# Patient Record
Sex: Female | Born: 2000 | Race: Black or African American | Hispanic: No | Marital: Single | Smoking: Never smoker
Health system: Southern US, Community
[De-identification: ages and names within clinical notes are randomized; demographics above are authoritative.]

## PROBLEM LIST (undated history)

## (undated) ENCOUNTER — Ambulatory Visit (HOSPITAL_COMMUNITY): Source: Home / Self Care

## (undated) DIAGNOSIS — R87629 Unspecified abnormal cytological findings in specimens from vagina: Secondary | ICD-10-CM

## (undated) DIAGNOSIS — Z789 Other specified health status: Secondary | ICD-10-CM

## (undated) HISTORY — DX: Other specified health status: Z78.9

## (undated) HISTORY — PX: NO PAST SURGERIES: SHX2092

## (undated) HISTORY — DX: Unspecified abnormal cytological findings in specimens from vagina: R87.629

---
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
## 2019-11-01 NOTE — L&D Delivery Note (Addendum)
OB/GYN Faculty Practice Delivery Note  Carla Flores is a 19 y.o. G1P0 s/p VD at [redacted]w[redacted]d.    ROM: 3h 63m with clear fluid GBS Status:  Negative/-- (09/17 1140) Maximum Maternal Temperature: 99.6 F  Labor Progress:  Patient presented to L&D for IOL for elevated blood pressure without dx of HTN. Initial SVE: thick/high/closed. Labor course was uncomplicated. She then progressed to complete.   Delivery Date/Time: 08/14/20 @ 1856 Delivery: Called to room and patient was complete and pushing. Head position was vertex and delivered with ease over the perineum. No nuchal cord present. Shoulder and body delivered in usual fashion. Infant with spontaneous cry, placed on mother's abdomen, dried and stimulated. Cord clamped x 2 after 1-minute delay, and cut by FOB. Cord blood drawn. Placenta delivered spontaneously with gentle cord traction. Fundus firm with massage and pitocin started. Labia, perineum, vagina, and cervix inspected and significant for bilateral sulcal tears and right labial 2nd degree.  Baby Weight: per chart  Cord: central insertion, 3 vessel Placenta: Sent to L&D, intact Complications: None Lacerations: bilateral sulcal and right labial, and was repaired in the standard fashion EBL: 827 cc Analgesia: Epidural  Infant: APGAR (1 MIN): 9   APGAR (5 MINS): 9   Herby Abraham MD, PGY-1 OBGYN Faculty Teaching Service  08/14/2020, 7:53 PM   I was gloved and present for entire delivery SVD without incident  No difficulty with shoulders Lacerations as listed above Repair of same supervised by me QBL 827: IV Pitocin, 800 mcg Cytotec and TXA administered  Clayton Bibles, CNM 08/14/20 8:16 PM

## 2020-02-19 ENCOUNTER — Ambulatory Visit (INDEPENDENT_AMBULATORY_CARE_PROVIDER_SITE_OTHER): Payer: Medicaid Other

## 2020-02-19 VITALS — Ht 66.0 in | Wt 123.0 lb

## 2020-02-19 DIAGNOSIS — Z3480 Encounter for supervision of other normal pregnancy, unspecified trimester: Secondary | ICD-10-CM

## 2020-02-19 DIAGNOSIS — Z3482 Encounter for supervision of other normal pregnancy, second trimester: Secondary | ICD-10-CM

## 2020-02-19 DIAGNOSIS — Z3A14 14 weeks gestation of pregnancy: Secondary | ICD-10-CM

## 2020-02-19 DIAGNOSIS — Z3402 Encounter for supervision of normal first pregnancy, second trimester: Secondary | ICD-10-CM | POA: Insufficient documentation

## 2020-02-19 MED ORDER — BLOOD PRESSURE CUFF MISC: 1.0000 | 0 refills | Status: DC

## 2020-02-19 MED ORDER — DOXYLAMINE-PYRIDOXINE 10-10 MG PO TBEC: 2.0000 | DELAYED_RELEASE_TABLET | Freq: Every day | ORAL | 5 refills | Status: DC

## 2020-02-19 MED ORDER — PREPLUS 27-1 MG PO TABS: 1.0000 | ORAL_TABLET | Freq: Every day | ORAL | 13 refills | Status: DC

## 2020-02-19 NOTE — Progress Notes (Signed)
..    Virtual Visit via Telephone Note  I connected with Carla Flores on 02/19/20 at  9:00 AM EDT by telephone and verified that I am speaking with the correct person using two identifiers.  Location: Patient: on the phone Provider: on the phone   I discussed the limitations, risks, security and privacy concerns of performing an evaluation and management service by telephone and the availability of in person appointments. I also discussed with the patient that there may be a patient responsible charge related to this service. The patient expressed understanding and agreed to proceed.   History of Present Illness: PRENATAL INTAKE SUMMARY  Ms. Mandarino presents today New OB Nurse Interview.  OB History   No obstetric history on file.    I have reviewed the patient's medical, obstetrical, social, and family histories, medications, and available lab results.  SUBJECTIVE She has no unusual complaints   Observations/Objective: Initial nurse interview for history/labs (New OB)  EDD: 08/13/20 GA: [redacted]w[redacted]d GP:G1P0     Assessment and Plan: Normal pregnancy (pt reports she had confirmation at the health dept.) Prenatal care Pt is scheduled for New OB appointment on 02/26/20.   Follow Up Instructions:   I discussed the assessment and treatment plan with the patient. The patient was provided an opportunity to ask questions and all were answered. The patient agreed with the plan and demonstrated an understanding of the instructions.   The patient was advised to call back or seek an in-person evaluation if the symptoms worsen or if the condition fails to improve as anticipated.  I provided 15 minutes of non-face-to-face time during this encounter.   Frutoso Chase, RN

## 2020-02-26 ENCOUNTER — Other Ambulatory Visit (HOSPITAL_COMMUNITY)
Admission: RE | Admit: 2020-02-26 | Discharge: 2020-02-26 | Disposition: A | Discharge status: Home / Self Care | Payer: Medicaid Other | Source: Ambulatory Visit | Attending: Obstetrics | Admitting: Obstetrics

## 2020-02-26 ENCOUNTER — Other Ambulatory Visit: Payer: Self-pay

## 2020-02-26 ENCOUNTER — Ambulatory Visit (INDEPENDENT_AMBULATORY_CARE_PROVIDER_SITE_OTHER): Payer: Medicaid Other | Admitting: Obstetrics

## 2020-02-26 ENCOUNTER — Encounter: Payer: Self-pay | Admitting: Obstetrics

## 2020-02-26 VITALS — BP 120/84 | HR 112 | Wt 123.0 lb

## 2020-02-26 DIAGNOSIS — Z3481 Encounter for supervision of other normal pregnancy, first trimester: Secondary | ICD-10-CM

## 2020-02-26 DIAGNOSIS — Z3402 Encounter for supervision of normal first pregnancy, second trimester: Secondary | ICD-10-CM | POA: Insufficient documentation

## 2020-02-26 MED ORDER — PRENATE MINI 29-0.6-0.4-350 MG PO CAPS: 1.0000 | ORAL_CAPSULE | Freq: Every day | ORAL | 3 refills | Status: DC

## 2020-02-26 MED ORDER — VITAFOL ULTRA 29-0.6-0.4-200 MG PO CAPS: 1.0000 | ORAL_CAPSULE | Freq: Every day | ORAL | 4 refills | Status: DC

## 2020-02-26 NOTE — Progress Notes (Signed)
NOB   NOB intake completed 02/19/20  CC:  NONE

## 2020-02-26 NOTE — Progress Notes (Addendum)
Subjective:    Carla Flores is being seen today for her first obstetrical visit.  This is not a planned pregnancy. She is at [redacted]w[redacted]d gestation. Her obstetrical history is significant for none. Relationship with FOB: significant other, not living together. Patient does intend to breast feed. Pregnancy history fully reviewed.  The information documented in the HPI was reviewed and verified.  Menstrual History: OB History    Gravida  1   Para      Term      Preterm      AB      Living        SAB      TAB      Ectopic      Multiple      Live Births               Patient's last menstrual period was 11/07/2019 (exact date).    History reviewed. No pertinent past medical history.  Past Surgical History:  Procedure Laterality Date  . NO PAST SURGERIES      (Not in a hospital admission)  No Known Allergies  Social History   Tobacco Use  . Smoking status: Never Smoker  . Smokeless tobacco: Never Used  Substance Use Topics  . Alcohol use: Never    Family History  Problem Relation Age of Onset  . Diabetes Maternal Grandmother      Review of Systems Constitutional: negative for weight loss Gastrointestinal: negative for vomiting Genitourinary:negative for genital lesions and vaginal discharge and dysuria Musculoskeletal:negative for back pain Behavioral/Psych: negative for abusive relationship, depression, illegal drug usage and tobacco use    Objective:    BP 120/84   Pulse (!) 112   Wt 123 lb (55.8 kg)   LMP 11/07/2019 (Exact Date)   BMI 19.85 kg/m  General Appearance:    Alert, cooperative, no distress, appears stated age  Head:    Normocephalic, without obvious abnormality, atraumatic  Eyes:    PERRL, conjunctiva/corneas clear, EOM's intact, fundi    benign, both eyes  Ears:    Normal TM's and external ear canals, both ears  Nose:   Nares normal, septum midline, mucosa normal, no drainage    or sinus tenderness  Throat:   Lips, mucosa,  and tongue normal; teeth and gums normal  Neck:   Supple, symmetrical, trachea midline, no adenopathy;    thyroid:  no enlargement/tenderness/nodules; no carotid   bruit or JVD  Back:     Symmetric, no curvature, ROM normal, no CVA tenderness  Lungs:     Clear to auscultation bilaterally, respirations unlabored  Chest Wall:    No tenderness or deformity   Heart:    Regular rate and rhythm, S1 and S2 normal, no murmur, rub   or gallop  Breast Exam:    No tenderness, masses, or nipple abnormality  Abdomen:     Soft, non-tender, bowel sounds active all four quadrants,    no masses, no organomegaly  Genitalia:    Normal female without lesion, discharge or tenderness  Extremities:   Extremities normal, atraumatic, no cyanosis or edema  Pulses:   2+ and symmetric all extremities  Skin:   Skin color, texture, turgor normal, no rashes or lesions  Lymph nodes:   Cervical, supraclavicular, and axillary nodes normal  Neurologic:   CNII-XII intact, normal strength, sensation and reflexes    throughout      Lab Review Urine pregnancy test Labs reviewed yes Radiologic studies reviewed no  Assessment:    Pregnancy at 15.[redacted] weeks gestation - doing well   Plan:     1. Encounter for supervision of normal first pregnancy in second trimester Rx: - Obstetric Panel, Including HIV - Culture, OB Urine - Genetic Screening - Cervicovaginal ancillary only( [place]) - Hepatitis C antibody - Enroll Patient in Babyscripts - AFP, Serum, Open Spina Bifida - Prenat w/o A-FeCbn-Meth-FA-DHA (PRENATE MINI) 29-0.6-0.4-350 MG CAPS; Take 1 capsule by mouth daily before breakfast.  Dispense: 90 capsule; Refill: 3 - [place] MFM OB COMP + 14 WK; Future  Prenatal vitamins.  Counseling provided regarding continued use of seat belts, cessation of alcohol consumption, smoking or use of illicit drugs; infection precautions i.e., influenza/TDAP immunizations, toxoplasmosis,CMV, parvovirus, listeria and varicella;  workplace safety, exercise during pregnancy; routine dental care, safe medications, sexual activity, hot tubs, saunas, pools, travel, caffeine use, fish and methlymercury, potential toxins, hair treatments, varicose veins Weight gain recommendations per IOM guidelines reviewed: underweight/BMI< 18.5--> gain 28 - 40 lbs; normal weight/BMI 18.5 - 24.9--> gain 25 - 35 lbs; overweight/BMI 25 - 29.9--> gain 15 - 25 lbs; obese/BMI >30->gain  11 - 20 lbs Problem list reviewed and updated. FIRST/CF mutation testing/NIPT/QUAD SCREEN/fragile X/Ashkenazi Jewish population testing/Spinal muscular atrophy discussed: requested. Role of ultrasound in pregnancy discussed; fetal survey: requested. Amniocentesis discussed: not indicated.   Meds ordered this encounter  Medications  . Prenat w/o A-FeCbn-Meth-FA-DHA (PRENATE MINI) 29-0.6-0.4-350 MG CAPS    Sig: Take 1 capsule by mouth daily before breakfast.    Dispense:  90 capsule    Refill:  3   Orders Placed This Encounter  Procedures  . Culture, OB Urine  . Obstetric Panel, Including HIV  . Genetic Screening    [place]  . Hepatitis C antibody  . AFP, Serum, Open Spina Bifida    Order Specific Question:   Is patient insulin dependent?    Answer:   No    Order Specific Question:   Patient weight (lb.)    Answer:   123 lb (55.8 kg)    Order Specific Question:   Gestational Age (GA), weeks    Answer:   53    Order Specific Question:   Date on which patient was at this GA    Answer:   02/26/2020    Order Specific Question:   GA Calculation Method    Answer:   LMP    Order Specific Question:   Number of fetuses    Answer:   1    Follow up in 4 weeks. 50% of 20 min visit spent on counseling and coordination of care.     Brock Bad, MD 02/26/2020 11:54 AM

## 2020-02-26 NOTE — Addendum Note (Signed)
Addended by: Coral Ceo A on: 02/26/2020 12:11 PM   Modules accepted: Orders

## 2020-02-27 LAB — CERVICOVAGINAL ANCILLARY ONLY
Bacterial Vaginitis (gardnerella): NEGATIVE
Candida Glabrata: NEGATIVE
Candida Vaginitis: POSITIVE — AB
Chlamydia: POSITIVE — AB
Comment: NEGATIVE
Comment: NEGATIVE
Comment: NEGATIVE
Comment: NEGATIVE
Comment: NEGATIVE
Comment: NORMAL
Neisseria Gonorrhea: NEGATIVE
Trichomonas: NEGATIVE

## 2020-02-28 ENCOUNTER — Other Ambulatory Visit: Payer: Self-pay | Admitting: Obstetrics

## 2020-02-28 DIAGNOSIS — B373 Candidiasis of vulva and vagina: Secondary | ICD-10-CM

## 2020-02-28 DIAGNOSIS — A749 Chlamydial infection, unspecified: Secondary | ICD-10-CM

## 2020-02-28 DIAGNOSIS — B3731 Acute candidiasis of vulva and vagina: Secondary | ICD-10-CM

## 2020-02-28 LAB — OBSTETRIC PANEL, INCLUDING HIV
Antibody Screen: NEGATIVE
Basophils Absolute: 0 10*3/uL (ref 0.0–0.2)
Basos: 1 %
EOS (ABSOLUTE): 0 10*3/uL (ref 0.0–0.4)
Eos: 1 %
HIV Screen 4th Generation wRfx: NONREACTIVE
Hematocrit: 35.7 % (ref 34.0–46.6)
Hemoglobin: 12.3 g/dL (ref 11.1–15.9)
Hepatitis B Surface Ag: NEGATIVE
Immature Grans (Abs): 0 10*3/uL (ref 0.0–0.1)
Immature Granulocytes: 1 %
Lymphocytes Absolute: 0.9 10*3/uL (ref 0.7–3.1)
Lymphs: 15 %
MCH: 32.9 pg (ref 26.6–33.0)
MCHC: 34.5 g/dL (ref 31.5–35.7)
MCV: 96 fL (ref 79–97)
Monocytes Absolute: 0.7 10*3/uL (ref 0.1–0.9)
Monocytes: 12 %
Neutrophils Absolute: 4.6 10*3/uL (ref 1.4–7.0)
Neutrophils: 70 %
Platelets: 177 10*3/uL (ref 150–450)
RBC: 3.74 x10E6/uL — ABNORMAL LOW (ref 3.77–5.28)
RDW: 12.9 % (ref 11.7–15.4)
RPR Ser Ql: NONREACTIVE
Rh Factor: POSITIVE
Rubella Antibodies, IGG: 2.76 index (ref >0.99)
WBC: 6.3 10*3/uL (ref 3.4–10.8)

## 2020-02-28 LAB — AFP, SERUM, OPEN SPINA BIFIDA
AFP MoM: 1.08
AFP Value: 40.9 ng/mL
Gest. Age on Collection Date: 15 weeks
Maternal Age At EDD: 21.4 yr
OSBR Risk 1 IN: 10000
Test Results:: NEGATIVE
Weight: 123 [lb_av]

## 2020-02-28 LAB — HEPATITIS C ANTIBODY: Hep C Virus Ab: <0.1 s/co ratio (ref 0.0–0.9)

## 2020-02-28 LAB — URINE CULTURE, OB REFLEX

## 2020-02-28 LAB — CULTURE, OB URINE

## 2020-02-28 MED ORDER — TERCONAZOLE 0.4 % VA CREA: 1.0000 | TOPICAL_CREAM | Freq: Every day | VAGINAL | 0 refills | Status: DC

## 2020-02-28 MED ORDER — AZITHROMYCIN 500 MG PO TABS: 1000.0000 mg | ORAL_TABLET | Freq: Once | ORAL | 0 refills | Status: DC

## 2020-02-29 ENCOUNTER — Other Ambulatory Visit: Payer: Self-pay | Admitting: Obstetrics

## 2020-02-29 DIAGNOSIS — N3000 Acute cystitis without hematuria: Secondary | ICD-10-CM

## 2020-02-29 MED ORDER — NITROFURANTOIN MONOHYD MACRO 100 MG PO CAPS: 100.0000 mg | ORAL_CAPSULE | Freq: Two times a day (BID) | ORAL | 2 refills | Status: DC

## 2020-03-02 ENCOUNTER — Telehealth: Payer: Self-pay | Admitting: *Deleted

## 2020-03-02 ENCOUNTER — Encounter: Payer: Self-pay | Admitting: Obstetrics

## 2020-03-02 NOTE — Telephone Encounter (Signed)
Pt informed of results and medications sent into [facility] for her.

## 2020-03-02 NOTE — Telephone Encounter (Signed)
-----   Message from Brock Bad, MD sent at 02/29/2020  7:09 AM EDT ----- Macrobid Rx for UTI

## 2020-03-12 ENCOUNTER — Encounter: Payer: Self-pay | Admitting: Obstetrics

## 2020-03-18 ENCOUNTER — Other Ambulatory Visit: Payer: Self-pay

## 2020-03-18 ENCOUNTER — Other Ambulatory Visit: Payer: Self-pay | Admitting: *Deleted

## 2020-03-18 ENCOUNTER — Ambulatory Visit (HOSPITAL_COMMUNITY): Payer: Medicaid Other | Attending: Obstetrics

## 2020-03-18 DIAGNOSIS — Z363 Encounter for antenatal screening for malformations: Secondary | ICD-10-CM | POA: Diagnosis not present

## 2020-03-18 DIAGNOSIS — Z3402 Encounter for supervision of normal first pregnancy, second trimester: Secondary | ICD-10-CM | POA: Insufficient documentation

## 2020-03-18 DIAGNOSIS — Z3A18 18 weeks gestation of pregnancy: Secondary | ICD-10-CM | POA: Diagnosis not present

## 2020-03-18 DIAGNOSIS — Z362 Encounter for other antenatal screening follow-up: Secondary | ICD-10-CM

## 2020-03-25 ENCOUNTER — Encounter: Payer: Self-pay | Admitting: Obstetrics

## 2020-03-25 ENCOUNTER — Telehealth (INDEPENDENT_AMBULATORY_CARE_PROVIDER_SITE_OTHER): Payer: Medicaid Other | Admitting: Obstetrics

## 2020-03-25 DIAGNOSIS — A749 Chlamydial infection, unspecified: Secondary | ICD-10-CM

## 2020-03-25 DIAGNOSIS — Z3A19 19 weeks gestation of pregnancy: Secondary | ICD-10-CM

## 2020-03-25 DIAGNOSIS — Z3402 Encounter for supervision of normal first pregnancy, second trimester: Secondary | ICD-10-CM

## 2020-03-25 DIAGNOSIS — O98312 Other infections with a predominantly sexual mode of transmission complicating pregnancy, second trimester: Secondary | ICD-10-CM

## 2020-03-25 MED ORDER — AZITHROMYCIN 500 MG PO TABS: 1000.0000 mg | ORAL_TABLET | Freq: Once | ORAL | 0 refills | Status: AC

## 2020-03-25 NOTE — Progress Notes (Signed)
Pt is on the phone preparing for virtual visit with provider, [redacted]w[redacted]d. Pt reports her BP cuff is malfunctioning. I advised pt [facility], pt voices understanding. Pt denies any symptoms of HA, blurry vision, or swelling.

## 2020-03-25 NOTE — Progress Notes (Signed)
OBSTETRICS PRENATAL VIRTUAL VISIT ENCOUNTER NOTE  Provider location: [facility]   I connected with Carla Flores on 03/25/20 at 11:00 AM EDT by MyChart Video Encounter at home and verified that I am speaking with the correct person using two identifiers.   I discussed the limitations, risks, security and privacy concerns of performing an evaluation and management service virtually and the availability of in person appointments. I also discussed with the patient that there may be a patient responsible charge related to this service. The patient expressed understanding and agreed to proceed. Subjective:  Carla Flores is a 19 y.o. G1P0 at [redacted]w[redacted]d being seen today for ongoing prenatal care.  She is currently monitored for the following issues for this low-risk pregnancy and has Encounter for supervision of normal first pregnancy in second trimester on their problem list.  Patient reports that she vomited up medication for chlamydia.  Contractions: Not present. Vag. Bleeding: None.  Movement: Present. Denies any leaking of fluid.   The following portions of the patient's history were reviewed and updated as appropriate: allergies, current medications, past family history, past medical history, past social history, past surgical history and problem list.   Objective:  There were no vitals filed for this visit.  Fetal Status:     Movement: Present     General:  Alert, oriented and cooperative. Patient is in no acute distress.  Respiratory: Normal respiratory effort, no problems with respiration noted  Mental Status: Normal mood and affect. Normal behavior. Normal judgment and thought content.  Rest of physical exam deferred due to type of encounter  Imaging: [place] MFM OB COMP + 14 WK  Result Date: 03/18/2020 ----------------------------------------------------------------------  OBSTETRICS REPORT                         (Signed Final 03/18/2020 03:54  pm) ---------------------------------------------------------------------- Patient Info  ID #:        [id number]                          D.O.B.:  2000/11/01 (19 yrs)  Name:        Carla Flores           Visit Date: 03/18/2020 11:35 am ---------------------------------------------------------------------- Performed By  Attending:         Lin Landsman      Ref. Address:      [address]                     [address]                                        [address]                                                               [phone]  [place]                                                               [id number]  Performed By:      Ellin Saba        [facility]  Referred By:       [facility] ---------------------------------------------------------------------- Orders  #   Description                          Code         Ordered By  1   [place] MFM OB COMP + 14 WK               76805.01     CHARLES HARPER ----------------------------------------------------------------------  #   Order #                    Accession #                 Episode #  1   [id number]                  [id number]                  [id number] ---------------------------------------------------------------------- Indications  Encounter for antenatal screening for           Z36.3  malformations  [redacted] weeks gestation of pregnancy                 Z3A.18  NEG NIPS, NEG AFP,  HORIZON Intermediate  [place] Fragile X  (declined genetic counseling) ---------------------------------------------------------------------- Fetal Evaluation  Num Of Fetuses:          1  Fetal Heart Rate(bpm):   142  Cardiac Activity:        Observed  Presentation:            Breech  Placenta:                Anterior  P. Cord Insertion:       Visualized, central  Amniotic Fluid  AFI FV:      Within normal limits                               Largest Pocket(cm)                              4.3 ---------------------------------------------------------------------- Biometry  BPD:      40.6   mm     G. Age:  18w 2d         27  %    CI:          75.2  %    70 - 86  FL/HC:       18.1  %    16.1 - 18.3  HC:      148.5   mm     G. Age:  18w 0d          8  %    HC/AC:       1.14       1.09 - 1.39  AC:      130.4   mm     G. Age:  18w 4d         36  %    FL/BPD:      66.3  %  FL:       26.9   mm     G. Age:  18w 1d         21  %    FL/AC:       20.6  %    20 - 24  HUM:      25.9   mm     G. Age:  18w 1d         31  %  CER:      18.1   mm     G. Age:  18w 0d         25  %  NFT:        3.3  mm  LV:         5.8  mm  CM:         4.2  mm  Est. FW:     236   gm      0 lb 8 oz     19  % ---------------------------------------------------------------------- OB History  Gravidity:     1 ---------------------------------------------------------------------- Gestational Age  LMP:            18w 6d       Date:  11/07/19                   EDD:  08/13/20  U/S Today:      18w 2d                                         EDD:  08/17/20  Best:           18w 6d    Det. By:  LMP  (11/07/19)            EDD:  08/13/20 ---------------------------------------------------------------------- Anatomy  Cranium:                Appears normal         LVOT:                   Appears normal  Cavum:                  Appears normal         Aortic Arch:            Not well visualized  Ventricles:             Appears normal         Ductal Arch:            Appears normal  Choroid Plexus:         Appears normal         Diaphragm:  Appears normal  Cerebellum:             Appears normal         Stomach:                Appears normal, left                                                                         sided  Posterior Fossa:        Appears normal         Abdomen:                Appears normal  Nuchal Fold:             Appears normal         Abdominal Wall:         Appears nml (cord                                                                         insert, abd wall)  Face:                   Appears normal         Cord Vessels:           Appears normal (3                          (orbits and profile)                           vessel cord)  Lips:                   Not well visualized    Kidneys:                Appear normal  Palate:                 Appears normal         Bladder:                Appears normal  Thoracic:               Appears normal         Spine:                  Not well visualized  Heart:                  Appears normal         Upper Extremities:      Appears normal                          (4CH, axis, and situs)  RVOT:                   Appears normal  Lower Extremities:      Appears normal  Other:   Female gender Technically difficult due to fetal position. ---------------------------------------------------------------------- Cervix Uterus Adnexa  Cervix  Length:            3.12  cm.  Normal appearance by transabdominal scan.  Right Ovary  Within normal limits.  Left Ovary  Within normal limits. ---------------------------------------------------------------------- Impression  Normal anatomy with measurements consistent with dates.  Suboptimal views of the fetal anatomy was obtained secondary  to fetal position.  Good fetal movement and amniotic fluid observed. ---------------------------------------------------------------------- Recommendations  Follow up as clinically indicated. ----------------------------------------------------------------------               Lin Landsman, MD Electronically Signed Final Report   03/18/2020 03:54 pm ----------------------------------------------------------------------   Assessment and Plan:  Pregnancy: G1P0 at [redacted]w[redacted]d 1. Encounter for supervision of normal first pregnancy in second trimester  2. Chlamydia infection affecting pregnancy,  antepartum Rx: - azithromycin (ZITHROMAX) 500 MG tablet; Take 2 tablets (1,000 mg total) by mouth once for 1 dose.  Dispense: 2 tablet; Refill: 0   Preterm labor symptoms and general obstetric precautions including but not limited to vaginal bleeding, contractions, leaking of fluid and fetal movement were reviewed in detail with the patient. I discussed the assessment and treatment plan with the patient. The patient was provided an opportunity to ask questions and all were answered. The patient agreed with the plan and demonstrated an understanding of the instructions. The patient was advised to call back or seek an in-person office evaluation/go to [facility] at [facility] for any urgent or concerning symptoms. Please refer to After Visit Summary for other counseling recommendations.   I provided 10 minutes of face-to-face time during this encounter.  Return in about 4 weeks (around 04/22/2020) for MyChart.  Future Appointments  Date Time Provider Department Center  04/15/2020 11:15 AM [facility] US[facility]    Coral Ceo, MD Center for [facility], [facility] 03/25/2020

## 2020-04-15 ENCOUNTER — Ambulatory Visit: Payer: Medicaid Other | Attending: Obstetrics and Gynecology

## 2020-04-15 ENCOUNTER — Other Ambulatory Visit: Payer: Self-pay | Admitting: *Deleted

## 2020-04-15 ENCOUNTER — Other Ambulatory Visit: Payer: Self-pay

## 2020-04-15 DIAGNOSIS — O365920 Maternal care for other known or suspected poor fetal growth, second trimester, not applicable or unspecified: Secondary | ICD-10-CM

## 2020-04-15 DIAGNOSIS — Z362 Encounter for other antenatal screening follow-up: Secondary | ICD-10-CM | POA: Diagnosis present

## 2020-04-15 DIAGNOSIS — Z3A22 22 weeks gestation of pregnancy: Secondary | ICD-10-CM | POA: Diagnosis not present

## 2020-04-16 ENCOUNTER — Other Ambulatory Visit: Payer: Self-pay

## 2020-04-16 DIAGNOSIS — O219 Vomiting of pregnancy, unspecified: Secondary | ICD-10-CM

## 2020-04-16 DIAGNOSIS — A749 Chlamydial infection, unspecified: Secondary | ICD-10-CM

## 2020-04-16 MED ORDER — AZITHROMYCIN 500 MG PO TABS: 1000.0000 mg | ORAL_TABLET | Freq: Once | ORAL | 0 refills | Status: AC

## 2020-04-16 MED ORDER — ONDANSETRON 4 MG PO TBDP: 4.0000 mg | ORAL_TABLET | Freq: Four times a day (QID) | ORAL | 0 refills | Status: DC | PRN

## 2020-04-16 NOTE — Progress Notes (Signed)
Consulted with Dr.Davis in the office regarding pt treatment Rx for CT and vomiting ok to send Zofran and tell pt to eat.  Rx sent Mychart message sent back to pt to make aware.

## 2020-04-16 NOTE — Progress Notes (Signed)
Mychart message received from pt expressing she is unable to keep Zithromax down a second time I consulted with Dr.Davis vis office Chat  advised to send Zofran and tell pt to eat. Rx sent pt notified through Mychart.

## 2020-04-22 ENCOUNTER — Telehealth (INDEPENDENT_AMBULATORY_CARE_PROVIDER_SITE_OTHER): Payer: Medicaid Other | Admitting: Women's Health

## 2020-04-22 ENCOUNTER — Encounter: Payer: Self-pay | Admitting: Women's Health

## 2020-04-22 DIAGNOSIS — Z3402 Encounter for supervision of normal first pregnancy, second trimester: Secondary | ICD-10-CM

## 2020-04-22 DIAGNOSIS — Z3A23 23 weeks gestation of pregnancy: Secondary | ICD-10-CM

## 2020-04-22 DIAGNOSIS — Z148 Genetic carrier of other disease: Secondary | ICD-10-CM

## 2020-04-22 DIAGNOSIS — A749 Chlamydial infection, unspecified: Secondary | ICD-10-CM | POA: Insufficient documentation

## 2020-04-22 NOTE — Patient Instructions (Signed)
Maternity Assessment Unit (MAU)  The Maternity Assessment Unit (MAU) is located at the [facility] [place] at [facility]. The address is: [address] [place], [place], [place] [address]. Please see map below for additional directions.    The Maternity Assessment Unit is designed to help you during your pregnancy, and for up to 6 weeks after delivery, with any pregnancy- or postpartum-related emergencies, if you think you are in labor, or if your water has broken. For example, if you experience nausea and vomiting, vaginal bleeding, severe abdominal or pelvic pain, elevated blood pressure or other problems related to your pregnancy or postpartum time, please come to the Maternity Assessment Unit for assistance.        Preterm Labor and Birth Information  The normal length of a pregnancy is 39-41 weeks. Preterm labor is when labor starts before 37 completed weeks of pregnancy. What are the risk factors for preterm labor? Preterm labor is more likely to occur in women who:  Have certain infections during pregnancy such as a bladder infection, sexually transmitted infection, or infection inside the uterus (chorioamnionitis).  Have a shorter-than-normal cervix.  Have gone into preterm labor before.  Have had surgery on their cervix.  Are younger than age 64 or older than age 26.  Are African American.  Are pregnant with twins or multiple babies (multiple gestation).  Take street drugs or smoke while pregnant.  Do not gain enough weight while pregnant.  Became pregnant shortly after having been pregnant. What are the symptoms of preterm labor? Symptoms of preterm labor include:  Cramps similar to those that can happen during a menstrual period. The cramps may happen with diarrhea.  Pain in the abdomen or lower back.  Regular uterine contractions that may feel like tightening of the abdomen.  A feeling of increased pressure in the  pelvis.  Increased watery or bloody mucus discharge from the vagina.  Water breaking (ruptured amniotic sac). Why is it important to recognize signs of preterm labor? It is important to recognize signs of preterm labor because babies who are born prematurely may not be fully developed. This can put them at an increased risk for:  Long-term (chronic) heart and lung problems.  Difficulty immediately after birth with regulating body systems, including blood sugar, body temperature, heart rate, and breathing rate.  Bleeding in the brain.  Cerebral palsy.  Learning difficulties.  Death. These risks are highest for babies who are born before 38 weeks of pregnancy. How is preterm labor treated? Treatment depends on the length of your pregnancy, your condition, and the health of your baby. It may involve:  Having a stitch (suture) placed in your cervix to prevent your cervix from opening too early (cerclage).  Taking or being given medicines, such as: ? Hormone medicines. These may be given early in pregnancy to help support the pregnancy. ? Medicine to stop contractions. ? Medicines to help mature the baby's lungs. These may be prescribed if the risk of delivery is high. ? Medicines to prevent your baby from developing cerebral palsy. If the labor happens before 34 weeks of pregnancy, you may need to stay in the hospital. What should I do if I think I am in preterm labor? If you think that you are going into preterm labor, call your health care provider right away. How can I prevent preterm labor in future pregnancies? To increase your chance of having a full-term pregnancy:  Do not use any tobacco products, such as  cigarettes, chewing tobacco, and e-cigarettes. If you need help quitting, ask your health care provider.  Do not use street drugs or medicines that have not been prescribed to you during your pregnancy.  Talk with your health care provider before taking any herbal  supplements, even if you have been taking them regularly.  Make sure you gain a healthy amount of weight during your pregnancy.  Watch for infection. If you think that you might have an infection, get it checked right away.  Make sure to tell your health care provider if you have gone into preterm labor before. This information is not intended to replace advice given to you by your health care provider. Make sure you discuss any questions you have with your health care provider. Document Revised: 02/08/2019 Document Reviewed: 03/09/2016 Elsevier Patient Education  [place].        Glucose Tolerance Test During Pregnancy Why am I having this test? The glucose tolerance test (GTT) is done to check how your body processes sugar (glucose). This is one of several tests used to diagnose diabetes that develops during pregnancy (gestational diabetes mellitus). Gestational diabetes is a temporary form of diabetes that some women develop during pregnancy. It usually occurs during the second trimester of pregnancy and goes away after delivery. Testing (screening) for gestational diabetes usually occurs between 24 and 28 weeks of pregnancy. You may have the GTT test after having a 1-hour glucose screening test if the results from that test indicate that you may have gestational diabetes. You may also have this test if:  You have a history of gestational diabetes.  You have a history of giving birth to very large babies or have experienced repeated fetal loss (stillbirth).  You have signs and symptoms of diabetes, such as: ? Changes in your vision. ? Tingling or numbness in your hands or feet. ? Changes in hunger, thirst, and urination that are not otherwise explained by your pregnancy. What is being tested? This test measures the amount of glucose in your blood at different times during a period of 3 hours. This indicates how well your body is able to process glucose. What kind of  sample is taken?  Blood samples are required for this test. They are usually collected by inserting a needle into a blood vessel. How do I prepare for this test?  For 3 days before your test, eat normally. Have plenty of carbohydrate-rich foods.  Follow instructions from your health care provider about: ? Eating or drinking restrictions on the day of the test. You may be asked to not eat or drink anything other than water (fast) starting 8-10 hours before the test. ? Changing or stopping your regular medicines. Some medicines may interfere with this test. Tell a health care provider about:  All medicines you are taking, including vitamins, herbs, eye drops, creams, and over-the-counter medicines.  Any blood disorders you have.  Any surgeries you have had.  Any medical conditions you have. What happens during the test? First, your blood glucose will be measured. This is referred to as your fasting blood glucose, since you fasted before the test. Then, you will drink a glucose solution that contains a certain amount of glucose. Your blood glucose will be measured again 1, 2, and 3 hours after drinking the solution. This test takes about 3 hours to complete. You will need to stay at the testing location during this time. During the testing period:  Do not eat or drink anything other than  the glucose solution.  Do not exercise.  Do not use any products that contain nicotine or tobacco, such as cigarettes and e-cigarettes. If you need help stopping, ask your health care provider. The testing procedure may vary among health care providers and hospitals. How are the results reported? Your results will be reported as milligrams of glucose per deciliter of blood (mg/dL) or millimoles per liter (mmol/L). Your health care provider will compare your results to normal ranges that were established after testing a large group of people (reference ranges). Reference ranges may vary among labs and  hospitals. For this test, common reference ranges are:  Fasting: less than 95-105 mg/dL (6.6-0.6 mmol/L).  1 hour after drinking glucose: less than 180-190 mg/dL (30.1-60.1 mmol/L).  2 hours after drinking glucose: less than 155-165 mg/dL (0.9-3.2 mmol/L).  3 hours after drinking glucose: 140-145 mg/dL (3.5-5.7 mmol/L). What do the results mean? Results within reference ranges are considered normal, meaning that your glucose levels are well-controlled. If two or more of your blood glucose levels are high, you may be diagnosed with gestational diabetes. If only one level is high, your health care provider may suggest repeat testing or other tests to confirm a diagnosis. Talk with your health care provider about what your results mean. Questions to ask your health care provider Ask your health care provider, or the department that is doing the test:  When will my results be ready?  How will I get my results?  What are my treatment options?  What other tests do I need?  What are my next steps? Summary  The glucose tolerance test (GTT) is one of several tests used to diagnose diabetes that develops during pregnancy (gestational diabetes mellitus). Gestational diabetes is a temporary form of diabetes that some women develop during pregnancy.  You may have the GTT test after having a 1-hour glucose screening test if the results from that test indicate that you may have gestational diabetes. You may also have this test if you have any symptoms or risk factors for gestational diabetes.  Talk with your health care provider about what your results mean. This information is not intended to replace advice given to you by your health care provider. Make sure you discuss any questions you have with your health care provider. Document Revised: 02/07/2019 Document Reviewed: 05/29/2017 Elsevier Patient Education  2020 [employer].

## 2020-04-22 NOTE — Progress Notes (Signed)
I connected with Carla Flores on 04/22/20 at 11:15 AM EDT by: MyChart video and verified that I am speaking with the correct person using two identifiers.  Patient is located at home and provider is located at [facility].     The purpose of this virtual visit is to provide medical care while limiting exposure to the novel coronavirus. I discussed the limitations, risks, security and privacy concerns of performing an evaluation and management service by MyChart video and the availability of in person appointments. I also discussed with the patient that there may be a patient responsible charge related to this service. By engaging in this virtual visit, you consent to the provision of healthcare.  Additionally, you authorize for your insurance to be billed for the services provided during this visit.  The patient expressed understanding and agreed to proceed.  The following staff members participated in the virtual visit:  Donia Ast    PRENATAL VISIT NOTE  Subjective:  Carla Flores is a 19 y.o. G1P0 at [redacted]w[redacted]d  for phone visit for ongoing prenatal care.  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  Patient reports no complaints.  Contractions: Not present. Vag. Bleeding: None.  Movement: Present. Denies leaking of fluid.   The following portions of the patient's history were reviewed and updated as appropriate: allergies, current medications, past family history, past medical history, past social history, past surgical history and problem list.   Objective:  There were no vitals filed for this visit. Self-Obtained  Fetal Status:     Movement: Present     Assessment and Plan:  Pregnancy: G1P0 at [redacted]w[redacted]d  1. Encounter for supervision of normal first pregnancy in second trimester -f/u  growth scan scheduled 05/07/2020 for EFW 8%, pt aware -anticipatory guidance given on upcoming visits -pt does not know how to use her BP cuff, will schedule nurse visit in next 1-2days -needs letter for [facility] to get Ensure, can pick up at nurse visit in 1-2days  2. Chlamydia infection -needs TOC, completed treatment 04/21/2020 -pt reports partner got treated  3. 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  Return in about 4 weeks (around 05/20/2020) for in-person LOB/APP OK/GTT/labs/CT TOC, needs nurse visit in 1-2days for education on BP cuff.  Future Appointments  Date Time Provider Department Center  04/22/2020 11:15 AM Robert Sperl, Odie Sera, NP [facility] None  05/07/2020 10:45 AM [facility] NURSE [facility]  05/07/2020 10:45 AM [facility] US5 [facility]     Time spent on virtual visit: 5 minutes  Marylen Ponto, NP

## 2020-04-22 NOTE — Progress Notes (Signed)
Virtual Visit via Telephone Note  I connected with Carla Flores on 04/22/20 at 11:15 AM EDT by telephone and verified that I am speaking with the correct person using two identifiers.  Pt does not know how to use her BP cuff.  [facility] - Fragile X Syndrome  Needs TOC CT - completed txt yesterday

## 2020-04-28 ENCOUNTER — Ambulatory Visit: Payer: Medicaid Other

## 2020-05-07 ENCOUNTER — Other Ambulatory Visit: Payer: Self-pay

## 2020-05-07 ENCOUNTER — Ambulatory Visit: Payer: Medicaid Other | Admitting: *Deleted

## 2020-05-07 ENCOUNTER — Ambulatory Visit: Payer: Medicaid Other | Attending: Obstetrics and Gynecology

## 2020-05-07 ENCOUNTER — Other Ambulatory Visit: Payer: Self-pay | Admitting: *Deleted

## 2020-05-07 DIAGNOSIS — O365920 Maternal care for other known or suspected poor fetal growth, second trimester, not applicable or unspecified: Secondary | ICD-10-CM

## 2020-05-07 DIAGNOSIS — Z3402 Encounter for supervision of normal first pregnancy, second trimester: Secondary | ICD-10-CM

## 2020-05-07 DIAGNOSIS — Z362 Encounter for other antenatal screening follow-up: Secondary | ICD-10-CM

## 2020-05-07 DIAGNOSIS — Z3A26 26 weeks gestation of pregnancy: Secondary | ICD-10-CM

## 2020-05-14 ENCOUNTER — Ambulatory Visit: Payer: Self-pay

## 2020-05-14 ENCOUNTER — Ambulatory Visit: Payer: Medicaid Other | Attending: Obstetrics and Gynecology | Admitting: Genetic Counselor

## 2020-05-14 ENCOUNTER — Other Ambulatory Visit: Payer: Self-pay

## 2020-05-14 DIAGNOSIS — Z315 Encounter for genetic counseling: Secondary | ICD-10-CM | POA: Diagnosis not present

## 2020-05-14 DIAGNOSIS — Z148 Genetic carrier of other disease: Secondary | ICD-10-CM | POA: Diagnosis not present

## 2020-05-14 NOTE — Progress Notes (Signed)
05/14/2020  Carla Flores [birth year] MRN: [id number] DOV: 05/14/2020  I connected withMs.Goinson7/15/21at10:30 AM ESTbyWebExand verified that I am speaking with the correct person using two identifiers.[facility] for a genetics consultation regardingher carrier status for fragile X syndrome. Carla Flores presented to her appointment alone.  Indication for genetic counseling - Intermediate carrier for fragile X syndrome  Prenatal history  Carla Flores is a G41P0, 19 y.o. female. Her current pregnancy has completed [redacted]w[redacted]d (Estimated Date of Delivery: 08/13/20).  Carla Flores denied exposure to environmental toxins or chemical agents. She denied the use of alcohol, tobacco or street drugs. She denied significant viral illnesses, fevers, and bleeding during the course of her pregnancy. Her medical and surgical histories were noncontributory.  Family History  A three generation pedigree was drafted and reviewed. Both family histories were reviewed and found to be noncontributory for birth defects, intellectual disability, recurrent pregnancy loss, and known genetic conditions.    The patient's ethnicity is African American. The father of the pregnancy's ethnicity is African American. Ashkenazi Jewish ancestry and consanguinity were denied. Pedigree will be scanned under Media.  Discussion  Fragile X syndrome:  Carla Flores had Horizon-14 carrier screening performed through [place]. The results of the screen detected one normal sized CGG repeat (30 repeats) and an intermediate sized CGG repeat (46 repeats) in the FMR1 gene associated with fragile X syndrome.  Fragile X syndrome is the most common inherited cause of intellectual disability. Fragile X syndrome is caused by mutations in the FMR1 gene on the X chromosome. Nearly all cases of fragile X syndrome are caused by mutations in the CGG trinucleotide repeat region of the FMR1  gene. Typically, the FMR1 gene contains 6 to 44 CGG trinucleotide repeats. Individuals with this number of repeats are not affected by fragile X syndrome. Individuals with fragile X syndrome have over 200 CGG repeats. This abnormally expanded CGG trinucleotide repeat segment silences the FMR1 gene, which prevents the production of the FMRP protein. Loss or deficiency of this protein leads to the symptoms associated with fragile X syndrome, including mild to moderate intellectual disability, developmental delays, autism, behavioral abnormalities, and characteristic physical features. Fragile X syndrome usually affects males more severely than females, since males have one X chromosome and females have two.   Repeat sizes of 45-54 in the CGG trinucleotide segment of the FMR1 gene are considered to be intermediate sized repeats. Repeats of this size are also commonly referred to as the "gray zone". Individuals with repeat alleles that fall in the intermediate range are not at risk of having a child affected by fragile X syndrome, as an intermediate allele is not at risk of expanding to a full mutation (>200 repeats) in one generation. Individuals who have an allele that falls in the intermediate range do not experience any symptoms themselves.   Individuals with 55-200 CGG trinucleotide repeats in the FMR1 gene are said to be premutation carriers for fragile X syndrome. Individuals with this number of repeats are at risk for their children to inherit an expanded allele (>200 repeats) and potentially be affected by fragile X syndrome. Premutation carriers also may experience symptoms of their own, such as symptoms associated with fragile X-associated primary ovarian insufficiency (FXPOI) or fragile X-associated tremor/ataxia syndrome (FXTAS).   The largest known repeat size to expand into a full mutation is 56 repeats, which falls in the premutation range. Carla Flores largest repeat size of 46 falls in the  intermediate range,  indicating that she is not at increased risk to have a child affected by fragile X syndrome. However, it is possible that an intermediate allele may expand into a premutation in the next generation; thus, Carla Flores children may be at risk to have children of their own who are affected by fragile X syndrome. Her children also may be at risk to develop FXTAS/FXPOI in the future if they are premutation carriers. It is recommended that Carla Flores's children receive preconception genetic counseling when they are considering having a family of their own to determine their chances of having a child with fragile X syndrome.  Carla Flores carrier screening was negative for the other 13 conditions screened. Thus, her risk to be a carrier for these additional conditions (listed separately in the laboratory report) has been reduced but not eliminated. This also significantly reduces her risk of having a child affected by one of these conditions.   Aneuploidy screening result:  We also reviewed that Carla Flores had Pan[facility] NIPS through the laboratory [facility] that was low-risk for fetal aneuploidies. We reviewed that these results showed a less than 1 in 10,000 risk for trisomies 21, 18 and 13, and monosomy X (Turner syndrome).  In addition, the risk for triploidy and sex chromosome trisomies (47,XXX and 47,XXY) was also low. 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 We reviewed that while this testing identifies 94-99% of pregnancies with trisomy 31, trisomy 55, trisomy 60, sex chromosome aneuploidies, and triploidy, it is NOT diagnostic. A positive test result requires confirmation by CVS or amniocentesis, and a negative test result does not rule out a fetal chromosome abnormality. She also understands that this testing does not identify all genetic conditions.  Diagnostic testing:  Carla Flores was also counseled regarding diagnostic  testing via amniocentesis. We discussed the technical aspects of the procedure and quoted up to a 1 in 500 (0.2%) risk for spontaneous pregnancy loss or other adverse pregnancy outcomes as a result of amniocentesis. Cultured cells from an amniocentesis sample allow for the visualization of a fetal karyotype, which can detect >99% of chromosomal aberrations. Chromosomal microarray can also be performed to identify smaller deletions or duplications of fetal chromosomal material. After careful consideration, Ms. Standifer declined amniocentesis at this time. She understands that amniocentesis is available at any point after 16 weeks of pregnancy and that she may opt to undergo the procedure at a later date should she change her mind.  Plan:  Additional screening and diagnostic testing were declined today. She understands that screening tests, including ultrasound, cannot rule out all birth defects or genetic syndromes. The patient was advised of this limitation and states she still does not want additional testing or screening at this time.   Following our session, I emailed Ms. Brummond several resources. One resource was a link to an article about understanding the implications of a fragile X intermediate result from the [employer]. Another resource was a link to the Early Check website. Early Check is a research study that assesses for fragile X syndrome and Duchenne/Becker muscular dystrophy as a part of newborn screening in [place]. We discussed that if Ms. Leite  was interested in participating in the Early Check study, she would get information regarding her baby's fragile X carrier status shortly after birth. She informed me during our session that she was interested in enrolling in this study.  I counseled Ms. Gugliotta regarding  the above risks and available options. Second year [employer] genetic counseling student Norlene Duel participated in portions of today's session under my  supervision. The approximate face-to-face time with the genetic counselor was 35 minutes.  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

## 2020-05-20 ENCOUNTER — Other Ambulatory Visit: Payer: Medicaid Other

## 2020-05-20 ENCOUNTER — Encounter: Payer: Medicaid Other | Admitting: Advanced Practice Midwife

## 2020-05-29 ENCOUNTER — Other Ambulatory Visit: Payer: Self-pay | Admitting: Obstetrics

## 2020-05-29 ENCOUNTER — Other Ambulatory Visit: Payer: Self-pay

## 2020-05-29 ENCOUNTER — Ambulatory Visit (INDEPENDENT_AMBULATORY_CARE_PROVIDER_SITE_OTHER): Payer: Medicaid Other | Admitting: Obstetrics

## 2020-05-29 ENCOUNTER — Encounter: Payer: Self-pay | Admitting: Obstetrics

## 2020-05-29 ENCOUNTER — Other Ambulatory Visit: Payer: Medicaid Other

## 2020-05-29 ENCOUNTER — Other Ambulatory Visit (HOSPITAL_COMMUNITY)
Admission: RE | Admit: 2020-05-29 | Discharge: 2020-05-29 | Disposition: A | Discharge status: Home / Self Care | Payer: Medicaid Other | Source: Ambulatory Visit | Attending: Obstetrics | Admitting: Obstetrics

## 2020-05-29 VITALS — BP 118/72 | HR 94 | Wt 140.3 lb

## 2020-05-29 DIAGNOSIS — Z3400 Encounter for supervision of normal first pregnancy, unspecified trimester: Secondary | ICD-10-CM

## 2020-05-29 DIAGNOSIS — B373 Candidiasis of vulva and vagina: Secondary | ICD-10-CM

## 2020-05-29 DIAGNOSIS — Z8619 Personal history of other infectious and parasitic diseases: Secondary | ICD-10-CM | POA: Insufficient documentation

## 2020-05-29 DIAGNOSIS — O98813 Other maternal infectious and parasitic diseases complicating pregnancy, third trimester: Secondary | ICD-10-CM

## 2020-05-29 DIAGNOSIS — Z3A29 29 weeks gestation of pregnancy: Secondary | ICD-10-CM

## 2020-05-29 DIAGNOSIS — M549 Dorsalgia, unspecified: Secondary | ICD-10-CM

## 2020-05-29 DIAGNOSIS — O26893 Other specified pregnancy related conditions, third trimester: Secondary | ICD-10-CM

## 2020-05-29 MED ORDER — COMFORT FIT MATERNITY SUPP SM MISC: 0 refills | Status: DC

## 2020-05-29 NOTE — Progress Notes (Signed)
Patient presents for ROB, GTT, and TOC for +CT on 02/26/20. Patient declines TDAP vaccine today. She has no concerns.

## 2020-05-29 NOTE — Progress Notes (Signed)
Subjective:  Carla Flores is a 19 y.o. G1P0 at [redacted]w[redacted]d being seen today for ongoing prenatal care.  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  Patient reports no complaints.  Contractions: Irritability. Vag. Bleeding: None.  Movement: Present. Denies leaking of fluid.   The following portions of the patient's history were reviewed and updated as appropriate: allergies, current medications, past family history, past medical history, past social history, past surgical history and problem list. Problem list updated.  Objective:   Vitals:   05/29/20 0958  BP: 118/72  Pulse: 94  Weight: 140 lb 4.8 oz (63.6 kg)    Fetal Status:     Movement: Present     General:  Alert, oriented and cooperative. Patient is in no acute distress.  Skin: Skin is warm and dry. No rash noted.   Cardiovascular: Normal heart rate noted  Respiratory: Normal respiratory effort, no problems with respiration noted  Abdomen: Soft, gravid, appropriate for gestational age. Pain/Pressure: Absent     Pelvic:  Cervical exam deferred        Extremities: Normal range of motion.  Edema: None  Mental Status: Normal mood and affect. Normal behavior. Normal judgment and thought content.   Urinalysis:      Assessment and Plan:  Pregnancy: G1P0 at [redacted]w[redacted]d  1. Supervision of normal first pregnancy, antepartum Rx: - Glucose Tolerance, 2 Hours w/1 Hour - CBC - HIV Antibody (routine testing w rflx) - RPR  2. History of chlamydia infection Rx: - Cervicovaginal ancillary only( [place])  3. Backache symptom Rx: - Elastic Bandages & Supports (COMFORT FIT MATERNITY SUPP SM) MISC; Wear as directed.  Dispense: 1 each; Refill: 0   Preterm labor symptoms and general obstetric precautions including but not limited to vaginal  bleeding, contractions, leaking of fluid and fetal movement were reviewed in detail with the patient. Please refer to After Visit Summary for other counseling recommendations.  No follow-ups on file.   Brock Bad, MD

## 2020-05-30 LAB — CBC
Hematocrit: 34.1 % (ref 34.0–46.6)
Hemoglobin: 11.3 g/dL (ref 11.1–15.9)
MCH: 32.9 pg (ref 26.6–33.0)
MCHC: 33.1 g/dL (ref 31.5–35.7)
MCV: 99 fL — ABNORMAL HIGH (ref 79–97)
Platelets: 155 10*3/uL (ref 150–450)
RBC: 3.43 x10E6/uL — ABNORMAL LOW (ref 3.77–5.28)
RDW: 12.5 % (ref 11.7–15.4)
WBC: 6.4 10*3/uL (ref 3.4–10.8)

## 2020-05-30 LAB — GLUCOSE TOLERANCE, 2 HOURS W/ 1HR
Glucose, 1 hour: 93 mg/dL (ref 65–179)
Glucose, 2 hour: 94 mg/dL (ref 65–152)
Glucose, Fasting: 77 mg/dL (ref 65–91)

## 2020-05-30 LAB — RPR: RPR Ser Ql: NONREACTIVE

## 2020-05-30 LAB — HIV ANTIBODY (ROUTINE TESTING W REFLEX): HIV Screen 4th Generation wRfx: NONREACTIVE

## 2020-06-01 ENCOUNTER — Other Ambulatory Visit: Payer: Self-pay | Admitting: Obstetrics

## 2020-06-01 DIAGNOSIS — B373 Candidiasis of vulva and vagina: Secondary | ICD-10-CM

## 2020-06-01 DIAGNOSIS — B3731 Acute candidiasis of vulva and vagina: Secondary | ICD-10-CM

## 2020-06-01 LAB — CERVICOVAGINAL ANCILLARY ONLY
Bacterial Vaginitis (gardnerella): NEGATIVE
Candida Glabrata: NEGATIVE
Candida Vaginitis: POSITIVE — AB
Chlamydia: NEGATIVE
Comment: NEGATIVE
Comment: NEGATIVE
Comment: NEGATIVE
Comment: NEGATIVE
Comment: NEGATIVE
Comment: NORMAL
Neisseria Gonorrhea: NEGATIVE
Trichomonas: NEGATIVE

## 2020-06-01 MED ORDER — TERCONAZOLE 0.4 % VA CREA: 1.0000 | TOPICAL_CREAM | Freq: Every day | VAGINAL | 0 refills | Status: DC

## 2020-06-02 ENCOUNTER — Encounter: Payer: Self-pay | Admitting: *Deleted

## 2020-06-05 ENCOUNTER — Ambulatory Visit: Payer: Medicaid Other | Admitting: *Deleted

## 2020-06-05 ENCOUNTER — Encounter: Payer: Self-pay | Admitting: *Deleted

## 2020-06-05 ENCOUNTER — Ambulatory Visit: Payer: Medicaid Other | Attending: Obstetrics and Gynecology

## 2020-06-05 ENCOUNTER — Other Ambulatory Visit: Payer: Self-pay | Admitting: *Deleted

## 2020-06-05 ENCOUNTER — Other Ambulatory Visit: Payer: Self-pay

## 2020-06-05 DIAGNOSIS — Z362 Encounter for other antenatal screening follow-up: Secondary | ICD-10-CM | POA: Diagnosis not present

## 2020-06-05 DIAGNOSIS — Z3402 Encounter for supervision of normal first pregnancy, second trimester: Secondary | ICD-10-CM

## 2020-06-05 DIAGNOSIS — O365930 Maternal care for other known or suspected poor fetal growth, third trimester, not applicable or unspecified: Secondary | ICD-10-CM

## 2020-06-05 DIAGNOSIS — Z3A30 30 weeks gestation of pregnancy: Secondary | ICD-10-CM

## 2020-06-12 ENCOUNTER — Telehealth (INDEPENDENT_AMBULATORY_CARE_PROVIDER_SITE_OTHER): Payer: Medicaid Other | Admitting: Obstetrics

## 2020-06-12 ENCOUNTER — Encounter: Payer: Self-pay | Admitting: Obstetrics

## 2020-06-12 VITALS — BP 121/69 | HR 86

## 2020-06-12 DIAGNOSIS — Z8619 Personal history of other infectious and parasitic diseases: Secondary | ICD-10-CM

## 2020-06-12 DIAGNOSIS — Z148 Genetic carrier of other disease: Secondary | ICD-10-CM

## 2020-06-12 DIAGNOSIS — Z3402 Encounter for supervision of normal first pregnancy, second trimester: Secondary | ICD-10-CM

## 2020-06-12 DIAGNOSIS — Z3A31 31 weeks gestation of pregnancy: Secondary | ICD-10-CM

## 2020-06-12 NOTE — Progress Notes (Signed)
Virtual Visit via Telephone Note  I connected with Carla Flores on 06/12/20 at 10:00 AM EDT by telephone and verified that I am speaking with the correct person using two identifiers.  No concerns to report per pt

## 2020-06-12 NOTE — Progress Notes (Signed)
OBSTETRICS PRENATAL VIRTUAL VISIT ENCOUNTER NOTE  Provider location: [facility]   I connected with Carla Flores on 06/12/20 at 10:00 AM EDT by MyChart Video Encounter at home and verified that I am speaking with the correct person using two identifiers.   I discussed the limitations, risks, security and privacy concerns of performing an evaluation and management service virtually and the availability of in person appointments. I also discussed with the patient that there may be a patient responsible charge related to this service. The patient expressed understanding and agreed to proceed. Subjective:  Carla Flores is a 19 y.o. G1P0 at [redacted]w[redacted]d being seen today for ongoing prenatal care.  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  Patient reports no complaints.  Contractions: Not present. Vag. Bleeding: None.  Movement: Present. Denies any leaking of fluid.   The following portions of the patient's history were reviewed and updated as appropriate: allergies, current medications, past family history, past medical history, past social history, past surgical history and problem list.   Objective:   Vitals:   06/12/20 1023  BP: 121/69  Pulse: 86    Fetal Status:     Movement: Present     General:  Alert, oriented and cooperative. Patient is in no acute distress.  Respiratory: Normal respiratory effort, no problems with respiration noted  Mental Status: Normal mood and affect. Normal behavior. Normal judgment and thought content.  Rest of physical exam deferred due to type of encounter  Imaging: [place] MFM OB FOLLOW UP  Result Date: 06/05/2020 ----------------------------------------------------------------------  OBSTETRICS REPORT                        (Signed Final 06/05/2020 12:37 pm) ---------------------------------------------------------------------- Patient Info  ID #:       [id number]                          D.O.B.:  Apr 11, 2001 (19 yrs)  Name:       Carla Flores           Visit Date: 06/05/2020 11:07 am ---------------------------------------------------------------------- Performed By  Attending:        Ma Rings MD         Ref. Address:     [address]                                                             [phone]  [place]                                                             [address]  Performed By:     Reinaldo Raddle            [facility]  Referred By:      [facility] ---------------------------------------------------------------------- Orders  #  Description                           Code        Ordered By  1  [place] MFM OB FOLLOW UP                   [phone]    Rosana Hoes ----------------------------------------------------------------------  #  Order #                     Accession #                Episode #  1  [id number]                   [id number]                 [id number] ---------------------------------------------------------------------- Indications  Small for gestational age fetus affecting      O36.5990  management of mother  Encounter for other antenatal screening        Z36.2  follow-up  NEG NIPS, NEG AFP,  HORIZON  Intermediate [place] Fragile X  [redacted] weeks gestation of pregnancy                Z3A.30 ---------------------------------------------------------------------- Vital Signs                                                 Height:        5'6"  ---------------------------------------------------------------------- Fetal Evaluation  Num Of Fetuses:         1  Fetal Heart Rate(bpm):  132  Cardiac  Activity:       Observed  Presentation:           Cephalic  Placenta:               Anterior  P. Cord Insertion:      Visualized, central  Amniotic Fluid  AFI FV:      Within normal limits  AFI Sum(cm)     %Tile       Largest Pocket(cm)  10.4            17          2.6  RUQ(cm)       RLQ(cm)       LUQ(cm)        LLQ(cm)  2.6           2.6           2.6            2.6 ---------------------------------------------------------------------- Biometry  BPD:      79.8  mm     G. Age:  32w 0d         89  %    CI:        78.44   %    70 - 86                                                          FL/HC:      18.6   %    19.2 - 21.4  HC:       285   mm     G. Age:  31w 2d         47  %    HC/AC:      1.14        0.99 - 1.21  AC:      250.8  mm     G. Age:  29w 2d         21  %    FL/BPD:     66.4   %    71 - 87  FL:         53  mm     G. Age:  28w 1d        2.8  %    FL/AC:      21.1   %    20 - 24  LV:        4.9  mm  Est. FW:    1370  gm           3 lb     14  % ---------------------------------------------------------------------- OB History  Gravidity:    1 ---------------------------------------------------------------------- Gestational Age  LMP:           30w 1d        Date:  11/07/19                 EDD:   08/13/20  U/S Today:     30w 1d                                        EDD:   08/13/20  Best:          Carla Hahn30w  1d     Det. By:  LMP  (11/07/19)          EDD:   08/13/20 ---------------------------------------------------------------------- Anatomy  Cranium:               Appears normal         LVOT:                   Previously seen  Cavum:                 Appears normal         Aortic Arch:            Previously seen  Ventricles:            Appears normal         Ductal Arch:            Previously seen  Choroid Plexus:        Previously seen        Diaphragm:               Appears normal  Cerebellum:            Previously seen        Stomach:                Appears normal, left                                                                        sided  Posterior Fossa:       Previously seen        Abdomen:                Appears normal  Nuchal Fold:           Previously seen        Abdominal Wall:         Previously seen  Face:                  Orbits and profile     Cord Vessels:           Appears normal (3                         previously seen                                vessel cord)  Lips:                  Previously seen        Kidneys:                Appear normal  Palate:                Previously seen        Bladder:                Appears normal  Thoracic:              Appears normal         Spine:  Previously seen  Heart:                 Previously seen        Upper Extremities:      Previously seen  RVOT:                  Previously seen        Lower Extremities:      Previously seen  Other:  Female gender previously seen. ---------------------------------------------------------------------- Cervix Uterus Adnexa  Cervix  Not visualized (advanced GA >24wks) ---------------------------------------------------------------------- Comments  This patient was seen for a follow up growth scan as the  EFW noted during her prior ultrasound exams were in the  lower normal range.  She denies any problems since her last  exam and reports that she has screened negative for  gestational diabetes.  She was informed that the the overall fetal growth continues  to measure in the lower normal range.  The fetus has  continued to increase in weight as compared to her prior  ultrasound exams.  There was normal amniotic fluid noted  today.  A follow up exam was scheduled in 3 weeks to assess the  fetal growth. ----------------------------------------------------------------------                   Ma Rings, MD Electronically Signed Final Report   06/05/2020 12:37 pm  ----------------------------------------------------------------------   Assessment and Plan:  Pregnancy: G1P0 at [redacted]w[redacted]d 1. Encounter for supervision of normal first pregnancy in second trimester  2. History of chlamydia infection, treated  3. 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 I discussed the assessment and treatment plan with the patient. The patient was provided an opportunity to ask questions and all were answered. The patient agreed with the plan and demonstrated an understanding of the instructions. The patient was advised to call back or seek an in-person office evaluation/go to [facility]U at [facility] for any urgent or concerning symptoms. Please refer to After Visit Summary for other counseling recommendations.   I provided 10 minutes of face-to-face time during this encounter.  Return in about 2 weeks (around 06/26/2020) for MyChart.  Future Appointments  Date Time Provider Department Center  06/26/2020  2:45 PM [facility] NURSE [facility]  06/26/2020  3:00 PM [facility] US1 [facility]    Coral Ceo, MD Center for [facility], [facility] 06/12/20

## 2020-06-26 ENCOUNTER — Other Ambulatory Visit: Payer: Self-pay

## 2020-06-26 ENCOUNTER — Telehealth (INDEPENDENT_AMBULATORY_CARE_PROVIDER_SITE_OTHER): Payer: Medicaid Other | Admitting: Obstetrics and Gynecology

## 2020-06-26 ENCOUNTER — Encounter: Payer: Self-pay | Admitting: *Deleted

## 2020-06-26 ENCOUNTER — Ambulatory Visit: Payer: Medicaid Other | Attending: Obstetrics and Gynecology

## 2020-06-26 ENCOUNTER — Encounter: Payer: Self-pay | Admitting: Obstetrics and Gynecology

## 2020-06-26 ENCOUNTER — Ambulatory Visit: Payer: Medicaid Other | Admitting: *Deleted

## 2020-06-26 VITALS — BP 125/78 | HR 85 | Wt 143.0 lb

## 2020-06-26 DIAGNOSIS — Z3A33 33 weeks gestation of pregnancy: Secondary | ICD-10-CM

## 2020-06-26 DIAGNOSIS — Z362 Encounter for other antenatal screening follow-up: Secondary | ICD-10-CM

## 2020-06-26 DIAGNOSIS — Z3402 Encounter for supervision of normal first pregnancy, second trimester: Secondary | ICD-10-CM

## 2020-06-26 DIAGNOSIS — O365930 Maternal care for other known or suspected poor fetal growth, third trimester, not applicable or unspecified: Secondary | ICD-10-CM

## 2020-06-26 NOTE — Patient Instructions (Signed)

## 2020-06-26 NOTE — Progress Notes (Addendum)
   MY CHART VIDEO VIRTUAL OBSTETRICS VISIT ENCOUNTER NOTE  I connected with Carla Flores on 06/26/20 at  8:50 AM EDT by My Chart video at home and verified that I am speaking with the correct person using two identifiers. Provider located at [facility] for [employer] [facility] [place].   I discussed the limitations, risks, security and privacy concerns of performing an evaluation and management service by My Chart video and the availability of in person appointments. I also discussed with the patient that there may be a patient responsible charge related to this service. The patient expressed understanding and agreed to proceed.  Subjective:  Carla Flores is a 19 y.o. G1P0 at [redacted]w[redacted]d being followed for ongoing prenatal care.  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  Patient reports no complaints.  Reports fetal movement; "a lot". Denies any contractions, bleeding or leaking of fluid.   The following portions of the patient's history were reviewed and updated as appropriate: allergies, current medications, past family history, past medical history, past social history, past surgical history and problem list.   Objective:   General:  Alert, oriented and cooperative.   Mental Status: Normal mood and affect perceived. Normal judgment and thought content.  Rest of physical exam deferred due to type of encounter  BP 125/78   Pulse 85   Wt 143 lb (64.9 kg)   LMP 11/07/2019 (Exact Date)   BMI 23.08 kg/m  **Done by patient's own at home BP cuff and scale  Assessment and Plan:  Pregnancy: G1P0 at [redacted]w[redacted]d  1. Encounter for supervision of normal first pregnancy in second trimester - Anticipatory guidance for GBS and cervical exam at nv  2. [redacted] weeks gestation of pregnancy   Preterm  labor symptoms and general obstetric precautions including but not limited to vaginal bleeding, contractions, leaking of fluid and fetal movement were reviewed in detail with the patient.  I discussed the assessment and treatment plan with the patient. The patient was provided an opportunity to ask questions and all were answered. The patient agreed with the plan and demonstrated an understanding of the instructions. The patient was advised to call back or seek an in-person office evaluation/go to MAU at [facility] for any urgent or concerning symptoms. Please refer to After Visit Summary for other counseling recommendations.   I provided 5 minutes of non-face-to-face time during this encounter. There was 5 minutes of chart review time spent prior to this encounter. Total time spent = 10 minutes.  Return in about 3 weeks (around 07/17/2020) for Return OB w/GBS.  Future Appointments  Date Time Provider Department Center  06/26/2020  2:45 PM [facility] NURSE [facility]  06/26/2020  3:00 PM [facility] US1 [facility]    Raelyn Mora, CNM Center for [employer], [facility]

## 2020-06-26 NOTE — Progress Notes (Signed)
Virtual ROB   Pt was able to check B/P while on the phone.   CC: None today.

## 2020-07-17 ENCOUNTER — Other Ambulatory Visit: Payer: Self-pay

## 2020-07-17 ENCOUNTER — Other Ambulatory Visit (HOSPITAL_COMMUNITY)
Admission: RE | Admit: 2020-07-17 | Discharge: 2020-07-17 | Disposition: A | Discharge status: Home / Self Care | Payer: Medicaid Other | Source: Ambulatory Visit | Attending: Family Medicine | Admitting: Family Medicine

## 2020-07-17 ENCOUNTER — Ambulatory Visit (INDEPENDENT_AMBULATORY_CARE_PROVIDER_SITE_OTHER): Payer: Medicaid Other | Admitting: Family Medicine

## 2020-07-17 VITALS — BP 121/87 | HR 100 | Wt 146.2 lb

## 2020-07-17 DIAGNOSIS — Z148 Genetic carrier of other disease: Secondary | ICD-10-CM

## 2020-07-17 DIAGNOSIS — A749 Chlamydial infection, unspecified: Secondary | ICD-10-CM | POA: Diagnosis present

## 2020-07-17 DIAGNOSIS — O98819 Other maternal infectious and parasitic diseases complicating pregnancy, unspecified trimester: Secondary | ICD-10-CM

## 2020-07-17 DIAGNOSIS — Z3402 Encounter for supervision of normal first pregnancy, second trimester: Secondary | ICD-10-CM

## 2020-07-17 NOTE — Progress Notes (Signed)
Subjective:  Carla Flores is a 19 y.o. G1P0 at [redacted]w[redacted]d being seen today for ongoing prenatal care.  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  Patient reports no complaints.  Contractions: Irritability. Vag. Bleeding: None.  Movement: Present. Denies leaking of fluid.   The following portions of the patient's history were reviewed and updated as appropriate: allergies, current medications, past family history, past medical history, past social history, past surgical history and problem list. Problem list updated.  Objective:   Vitals:   07/17/20 1127  BP: 121/87  Pulse: 100  Weight: 146 lb 3.2 oz (66.3 kg)    Fetal Status: Fetal Heart Rate (bpm): 145 Fundal Height: 35 cm Movement: Present     General:  Alert, oriented and cooperative. Patient is in no acute distress.  Skin: Skin is warm and dry. No rash noted.   Cardiovascular: Normal heart rate noted  Respiratory: Normal respiratory effort, no problems with respiration noted  Abdomen: Soft, gravid, appropriate for gestational age. Pain/Pressure: Present     Pelvic:  Cervical exam deferred      External genitalia normal appearing.  Extremities: Normal range of motion.  Edema: None  Mental Status: Normal mood and affect. Normal behavior. Normal judgment and thought content.   Urinalysis:      Assessment and Plan:  Pregnancy: G1P0 at [redacted]w[redacted]d  Encounter for supervision of normal first pregnancy in second trimester -Doing well without complaints -Taking PNV -Discussed contraceptive options today, patient elects for PP Liletta -     Strep Gp B NAA -     Cervicovaginal ancillary only( [place])  Chlamydia infection affecting pregnancy, antepartum Positive 02/26/20, treated, neg TOC. 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 No showed genetic appt.   Preterm labor symptoms and general obstetric precautions including but not limited to vaginal bleeding, contractions, leaking of fluid and fetal movement were reviewed in detail with the patient. Please refer to After Visit Summary for other counseling recommendations.  Return in about 1 week (around 07/24/2020) for LOB; in person.   Alric Seton, MD

## 2020-07-17 NOTE — Progress Notes (Signed)
Patient presents for ROB and GBS. Patient declines flu vaccine today.

## 2020-07-19 LAB — STREP GP B NAA: Strep Gp B NAA: NEGATIVE

## 2020-07-20 LAB — CERVICOVAGINAL ANCILLARY ONLY
Chlamydia: NEGATIVE
Comment: NEGATIVE
Comment: NORMAL
Neisseria Gonorrhea: NEGATIVE

## 2020-07-24 ENCOUNTER — Other Ambulatory Visit: Payer: Self-pay

## 2020-07-24 ENCOUNTER — Ambulatory Visit (INDEPENDENT_AMBULATORY_CARE_PROVIDER_SITE_OTHER): Payer: Medicaid Other | Admitting: Obstetrics and Gynecology

## 2020-07-24 ENCOUNTER — Encounter: Payer: Self-pay | Admitting: Obstetrics and Gynecology

## 2020-07-24 VITALS — BP 121/81 | HR 99 | Wt 151.8 lb

## 2020-07-24 DIAGNOSIS — Z3A37 37 weeks gestation of pregnancy: Secondary | ICD-10-CM

## 2020-07-24 DIAGNOSIS — Z3402 Encounter for supervision of normal first pregnancy, second trimester: Secondary | ICD-10-CM

## 2020-07-24 NOTE — Progress Notes (Signed)
Pt presents for ROB reports no complaints today.  

## 2020-07-24 NOTE — Progress Notes (Signed)
   PRENATAL VISIT NOTE  Subjective:  Carla Flores is a 19 y.o. G1P0 at [redacted]w[redacted]d being seen today for ongoing prenatal care.  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  Patient reports no complaints.  Contractions: Not present. Vag. Bleeding: None.  Movement: Present. Denies leaking of fluid.   The following portions of the patient's history were reviewed and updated as appropriate: allergies, current medications, past family history, past medical history, past social history, past surgical history and problem list.   Objective:   Vitals:   07/24/20 0857  BP: 121/81  Pulse: 99  Weight: 151 lb 12.8 oz (68.9 kg)    Fetal Status: Fetal Heart Rate (bpm): 142 (Simultaneous filing. User may not have seen previous data.) Fundal Height: 36 cm Movement: Present     General:  Alert, oriented and cooperative. Patient is in no acute distress.  Skin: Skin is warm and dry. No rash noted.   Cardiovascular: Normal heart rate noted  Respiratory: Normal respiratory effort, no problems with respiration noted  Abdomen: Soft, gravid, appropriate for gestational age.  Pain/Pressure: Present     Pelvic: Cervical exam deferred        Extremities: Normal range of motion.  Edema: None  Mental Status: Normal mood and affect. Normal behavior. Normal judgment and thought content.   Assessment and Plan:  Pregnancy: G1P0 at [redacted]w[redacted]d 1. Encounter for supervision of normal first pregnancy in second trimester Patient is doing well without complaints Patient is still researching pediatricians  Term labor symptoms and general obstetric precautions including but not limited to vaginal bleeding, contractions, leaking of fluid and fetal movement were reviewed in detail with the patient. Please refer  to After Visit Summary for other counseling recommendations.   Return in about 1 week (around 07/31/2020) for in person, ROB, Low risk.  No future appointments.  Catalina Antigua, MD

## 2020-07-31 ENCOUNTER — Other Ambulatory Visit: Payer: Self-pay

## 2020-07-31 ENCOUNTER — Ambulatory Visit (INDEPENDENT_AMBULATORY_CARE_PROVIDER_SITE_OTHER): Payer: Medicaid Other | Admitting: Obstetrics and Gynecology

## 2020-07-31 VITALS — BP 120/78 | HR 90 | Wt 151.3 lb

## 2020-07-31 DIAGNOSIS — Z148 Genetic carrier of other disease: Secondary | ICD-10-CM

## 2020-07-31 DIAGNOSIS — Z3402 Encounter for supervision of normal first pregnancy, second trimester: Secondary | ICD-10-CM

## 2020-07-31 DIAGNOSIS — Z3A38 38 weeks gestation of pregnancy: Secondary | ICD-10-CM | POA: Insufficient documentation

## 2020-07-31 NOTE — Progress Notes (Signed)
   PRENATAL VISIT NOTE  Subjective:  Carla Flores is a 19 y.o. G1P0 at [redacted]w[redacted]d being seen today for ongoing prenatal care.  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  Patient doing well with no acute concerns today. She reports no complaints.  Contractions: Irritability. Vag. Bleeding: None.  Movement: Present. Denies leaking of fluid. Pt noted itchy patch on abdomen  The following portions of the patient's history were reviewed and updated as appropriate: allergies, current medications, past family history, past medical history, past social history, past surgical history and problem list. Problem list updated.  Objective:   Vitals:   07/31/20 1110  BP: 120/78  Pulse: 90  Weight: 151 lb 4.8 oz (68.6 kg)    Fetal Status: Fetal Heart Rate (bpm): 148   Movement: Present     General:  Alert, oriented and cooperative. Patient is in no acute distress.  Skin: Skin is warm and dry. No rash noted.   Cardiovascular: Normal heart rate noted  Respiratory: Normal respiratory effort, no problems with respiration noted  Abdomen: Soft, gravid, appropriate for gestational age.  Pain/Pressure: Present     Pelvic: Cervical exam deferred        Extremities: Normal range of motion.  Edema: None  Mental Status:  Normal mood and affect. Normal behavior. Normal judgment and thought content.  Skin evaluated, no obvious rash or skin defect noted at pt defined area.  Only stretch marks noted. Assessment and Plan:  Pregnancy: G1P0 at [redacted]w[redacted]d  1. 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 Encounter for supervision of normal first pregnancy in second trimester   3. [redacted] weeks gestation of pregnancy  4. Pruritis in pregnancy:    No detectable lesion.  Advised  cocoa butter and/or benadryl cream for affected area.   Term labor symptoms and general obstetric precautions including but not limited to vaginal bleeding, contractions, leaking of fluid and fetal movement were reviewed in detail with the patient.  Please refer to After Visit Summary for other counseling recommendations.   Return in about 1 week (around 08/07/2020) for ROB, in person.   Mariel Aloe, MD

## 2020-07-31 NOTE — Patient Instructions (Signed)

## 2020-07-31 NOTE — Progress Notes (Signed)
Pt is here for ROB, [redacted]w[redacted]d.

## 2020-08-10 ENCOUNTER — Encounter: Payer: Medicaid Other | Admitting: Advanced Practice Midwife

## 2020-08-12 ENCOUNTER — Ambulatory Visit (INDEPENDENT_AMBULATORY_CARE_PROVIDER_SITE_OTHER): Payer: Medicaid Other | Admitting: Obstetrics and Gynecology

## 2020-08-12 ENCOUNTER — Other Ambulatory Visit: Payer: Self-pay

## 2020-08-12 ENCOUNTER — Other Ambulatory Visit: Payer: Self-pay | Admitting: Advanced Practice Midwife

## 2020-08-12 VITALS — BP 128/92 | HR 112 | Wt 151.0 lb

## 2020-08-12 DIAGNOSIS — Z3A39 39 weeks gestation of pregnancy: Secondary | ICD-10-CM

## 2020-08-12 DIAGNOSIS — Z3403 Encounter for supervision of normal first pregnancy, third trimester: Secondary | ICD-10-CM

## 2020-08-12 DIAGNOSIS — Z3402 Encounter for supervision of normal first pregnancy, second trimester: Secondary | ICD-10-CM

## 2020-08-12 DIAGNOSIS — R030 Elevated blood-pressure reading, without diagnosis of hypertension: Secondary | ICD-10-CM

## 2020-08-12 NOTE — Progress Notes (Signed)
Pt presents for ROB requests cx check today. IOL forms completed today

## 2020-08-12 NOTE — Progress Notes (Signed)
   PRENATAL VISIT NOTE  Subjective:  Carla Flores is a 19 y.o. G1P0 at [redacted]w[redacted]d being seen today for ongoing prenatal care.  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  Patient reports no complaints.  Contractions: Not present. Vag. Bleeding: None.  Movement: Present. Denies leaking of fluid.   The following portions of the patient's history were reviewed and updated as appropriate: allergies, current medications, past family history, past medical history, past social history, past surgical history and problem list.   Objective:   Vitals:   08/12/20 1347 08/12/20 1355  BP: (!) 134/92 (!) 128/91  Pulse: (!) 112   Weight: 151 lb (68.5 kg)     Fetal Status: Fetal Heart Rate (bpm): 141   Movement: Present     General:  Alert, oriented and cooperative. Patient is in no acute distress.  Skin: Skin is warm and dry. No rash noted.   Cardiovascular: Normal heart rate noted  Respiratory: Normal respiratory effort, no problems with respiration noted  Abdomen: Soft, gravid, appropriate for gestational age.  Pain/Pressure: Present     Pelvic: Cervical exam performed in the presence of a chaperone        Extremities: Normal range of motion.  Edema: Trace  Mental Status: Normal mood and affect. Normal behavior. Normal judgment and thought content.   Assessment and Plan:  Pregnancy: G1P0 at [redacted]w[redacted]d  1. [redacted] weeks gestation of pregnancy  GBS negative   2. Encounter for supervision of normal first pregnancy in third trimester   3. Elevated BP without diagnosis of hypertension  - asymptomatic  - No other elevated BP's throughout pregnancy. - Discussed with Dr.  Alysia Penna, Will plan for induction tomorrow at [facility]. Orders placed.  - PCR, CBC & CMP today.   Term labor symptoms and general obstetric precautions including but not limited to vaginal bleeding, contractions, leaking of fluid and fetal movement were reviewed in detail with the patient. Please refer to After Visit Summary for other counseling recommendations.   No follow-ups on file.  No future appointments.  Venia Carbon, NP

## 2020-08-13 ENCOUNTER — Encounter (HOSPITAL_COMMUNITY): Payer: Self-pay | Admitting: Obstetrics and Gynecology

## 2020-08-13 ENCOUNTER — Inpatient Hospital Stay (HOSPITAL_COMMUNITY)
Admission: AD | Admit: 2020-08-13 | Discharge: 2020-08-16 | DRG: 807 | Disposition: A | Discharge status: Home / Self Care | Payer: Medicaid Other | Attending: Obstetrics & Gynecology | Admitting: Obstetrics & Gynecology

## 2020-08-13 ENCOUNTER — Other Ambulatory Visit: Payer: Self-pay

## 2020-08-13 ENCOUNTER — Inpatient Hospital Stay (HOSPITAL_COMMUNITY): Payer: Medicaid Other

## 2020-08-13 DIAGNOSIS — A749 Chlamydial infection, unspecified: Secondary | ICD-10-CM | POA: Diagnosis present

## 2020-08-13 DIAGNOSIS — O26893 Other specified pregnancy related conditions, third trimester: Principal | ICD-10-CM | POA: Diagnosis present

## 2020-08-13 DIAGNOSIS — R030 Elevated blood-pressure reading, without diagnosis of hypertension: Secondary | ICD-10-CM | POA: Diagnosis present

## 2020-08-13 DIAGNOSIS — Z148 Genetic carrier of other disease: Secondary | ICD-10-CM

## 2020-08-13 DIAGNOSIS — O98819 Other maternal infectious and parasitic diseases complicating pregnancy, unspecified trimester: Secondary | ICD-10-CM | POA: Diagnosis present

## 2020-08-13 DIAGNOSIS — Z3A40 40 weeks gestation of pregnancy: Secondary | ICD-10-CM | POA: Diagnosis not present

## 2020-08-13 DIAGNOSIS — Z20822 Contact with and (suspected) exposure to covid-19: Secondary | ICD-10-CM | POA: Diagnosis present

## 2020-08-13 DIAGNOSIS — Z3402 Encounter for supervision of normal first pregnancy, second trimester: Secondary | ICD-10-CM

## 2020-08-13 LAB — CBC
HCT: 33.9 % — ABNORMAL LOW (ref 36.0–46.0)
Hemoglobin: 11.5 g/dL — ABNORMAL LOW (ref 12.0–15.0)
MCH: 31.8 pg (ref 26.0–34.0)
MCHC: 33.9 g/dL (ref 30.0–36.0)
MCV: 93.6 fL (ref 80.0–100.0)
Platelets: 146 10*3/uL — ABNORMAL LOW (ref 150–400)
RBC: 3.62 MIL/uL — ABNORMAL LOW (ref 3.87–5.11)
RDW: 13.2 % (ref 11.5–15.5)
WBC: 8.1 10*3/uL (ref 4.0–10.5)
nRBC: 0 % (ref 0.0–0.2)

## 2020-08-13 LAB — COMPREHENSIVE METABOLIC PANEL
ALT: 10 U/L (ref 0–44)
AST: 18 U/L (ref 15–41)
Albumin: 3 g/dL — ABNORMAL LOW (ref 3.5–5.0)
Alkaline Phosphatase: 161 U/L — ABNORMAL HIGH (ref 38–126)
Anion gap: 8 (ref 5–15)
BUN: <5 mg/dL — ABNORMAL LOW (ref 6–20)
CO2: 21 mmol/L — ABNORMAL LOW (ref 22–32)
Calcium: 8.9 mg/dL (ref 8.9–10.3)
Chloride: 107 mmol/L (ref 98–111)
Creatinine, Ser: 0.71 mg/dL (ref 0.44–1.00)
GFR, Estimated: >60 mL/min (ref >60)
Glucose, Bld: 87 mg/dL (ref 70–99)
Potassium: 4.2 mmol/L (ref 3.5–5.1)
Sodium: 136 mmol/L (ref 135–145)
Total Bilirubin: 0.6 mg/dL (ref 0.3–1.2)
Total Protein: 6.3 g/dL — ABNORMAL LOW (ref 6.5–8.1)

## 2020-08-13 LAB — PROTEIN / CREATININE RATIO, URINE
Creatinine, Urine: 75.68 mg/dL
Protein Creatinine Ratio: 0.11 mg/mg{Cre} (ref 0.00–0.15)
Total Protein, Urine: 8 mg/dL

## 2020-08-13 LAB — TYPE AND SCREEN
ABO/RH(D): B POS
Antibody Screen: NEGATIVE

## 2020-08-13 LAB — RESPIRATORY PANEL BY RT PCR (FLU A&B, COVID)
Influenza A by PCR: NEGATIVE
Influenza B by PCR: NEGATIVE
SARS Coronavirus 2 by RT PCR: NEGATIVE

## 2020-08-13 MED ORDER — OXYTOCIN BOLUS FROM INFUSION
333.0000 mL | Freq: Once | INTRAVENOUS | Status: AC
  Administered 2020-08-14: 333 mL via INTRAVENOUS

## 2020-08-13 MED ORDER — MISOPROSTOL 50MCG HALF TABLET
ORAL_TABLET | ORAL | Status: AC
  Administered 2020-08-13: 50 ug via BUCCAL
  Filled 2020-08-13: qty 1

## 2020-08-13 MED ORDER — OXYCODONE-ACETAMINOPHEN 5-325 MG PO TABS: 1.0000 | ORAL_TABLET | ORAL | Status: DC | PRN

## 2020-08-13 MED ORDER — LACTATED RINGERS IV SOLN: INTRAVENOUS | Status: DC

## 2020-08-13 MED ORDER — ACETAMINOPHEN 325 MG PO TABS: 650.0000 mg | ORAL_TABLET | ORAL | Status: DC | PRN

## 2020-08-13 MED ORDER — ONDANSETRON HCL 4 MG/2ML IJ SOLN
4.0000 mg | Freq: Four times a day (QID) | INTRAMUSCULAR | Status: DC | PRN
  Administered 2020-08-14: 4 mg via INTRAVENOUS
  Filled 2020-08-13: qty 2

## 2020-08-13 MED ORDER — FLEET ENEMA 7-19 GM/118ML RE ENEM: 1.0000 | ENEMA | Freq: Once | RECTAL | Status: DC

## 2020-08-13 MED ORDER — OXYCODONE-ACETAMINOPHEN 5-325 MG PO TABS: 2.0000 | ORAL_TABLET | ORAL | Status: DC | PRN

## 2020-08-13 MED ORDER — TERBUTALINE SULFATE 1 MG/ML IJ SOLN: 0.2500 mg | Freq: Once | INTRAMUSCULAR | Status: DC | PRN

## 2020-08-13 MED ORDER — SOD CITRATE-CITRIC ACID 500-334 MG/5ML PO SOLN: 30.0000 mL | ORAL | Status: DC | PRN

## 2020-08-13 MED ORDER — LACTATED RINGERS IV SOLN: 500.0000 mL | INTRAVENOUS | Status: DC | PRN

## 2020-08-13 MED ORDER — MISOPROSTOL 50MCG HALF TABLET
50.0000 ug | ORAL_TABLET | ORAL | Status: DC
  Administered 2020-08-13 (×2): 50 ug via BUCCAL
  Filled 2020-08-13 (×2): qty 1

## 2020-08-13 MED ORDER — OXYTOCIN-SODIUM CHLORIDE 30-0.9 UT/500ML-% IV SOLN: 1.0000 m[IU]/min | INTRAVENOUS | Status: DC

## 2020-08-13 MED ORDER — OXYTOCIN-SODIUM CHLORIDE 30-0.9 UT/500ML-% IV SOLN
2.5000 [IU]/h | INTRAVENOUS | Status: DC
  Filled 2020-08-13: qty 500

## 2020-08-13 MED ORDER — LIDOCAINE HCL (PF) 1 % IJ SOLN
30.0000 mL | INTRAMUSCULAR | Status: DC | PRN
  Administered 2020-08-14: 30 mL via SUBCUTANEOUS
  Filled 2020-08-13: qty 30

## 2020-08-13 NOTE — Progress Notes (Signed)
LABOR PROGRESS NOTE  Carla Flores is a 19 y.o. G1P0 at [redacted]w[redacted]d admitted for IOL for gHTN  Subjective: Patient feeling contractions more.   Objective: BP 110/76   Pulse 87   Temp 98.6 F (37 C) (Oral)   Resp 16   Ht 5\' 9"  (1.753 m)   Wt 68.5 kg   LMP 11/07/2019 (Exact Date)   BMI 22.30 kg/m   Dilation: Closed Effacement (%): Thick Station: -3 Presentation: Vertex (verified by ultrasound) Exam by:: melissa wilkins,RN Fetal monitoring: Baseline: 150 bpm, Variability: mod variability Accelerations: present, and Decelerations: Absent Uterine activity: Frequency: q2-3 mins  Labs: Lab Results  Component Value Date   WBC 8.1 08/13/2020   HGB 11.5 (L) 08/13/2020   HCT 33.9 (L) 08/13/2020   MCV 93.6 08/13/2020   PLT 146 (L) 08/13/2020    Patient Active Problem List   Diagnosis Date Noted  . Elevated BP without diagnosis of hypertension 08/12/2020  . Chlamydia infection affecting pregnancy 04/22/2020  . 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 Encounter for supervision of normal first pregnancy in second trimester 02/19/2020    Assessment / Plan: IOL for gHTN  #Labor: Initial SVE closed/thick high, now 1/50/-2. S/p Cytotec x2. FB placed without difficulty. Will proceed w additional dose of cytotec and reassess in four hours.   #elevated pressure reading wo dx of gHTN: Had elevated BP in clinic 10/13 to 134/92 and given her gestational age she was scheduled for IOL. Currently normotensive, will continue to monitor. PEC labs normal.  #Pain: per request, desires epidural #FWB:  cat 1 #ID: [id number] #anticipated del: VD   11/13, MD [facility] Family Medicine Fellow, [facility] for [facility], [facility] 08/13/2020, 11:34 PM

## 2020-08-13 NOTE — Progress Notes (Signed)
LABOR PROGRESS NOTE  Carla Flores is a 19 y.o. G1P0 at [redacted]w[redacted]d admitted for IOL for gHTN  Subjective: Patient reports the she is starting to feel painful contractions.  Objective: BP (!) 109/59    Pulse 90    Temp 98.6 F (37 C) (Oral)    Resp 16    Ht 5\' 9"  (1.753 m)    Wt 68.5 kg    LMP 11/07/2019 (Exact Date)    BMI 22.30 kg/m   Dilation: Closed Effacement (%): Thick Station: -3 Presentation: Vertex (verified by ultrasound) Exam by:: melissa wilkins,RN Fetal monitoring: Baseline: 145 bpm, Variability: Good {> 6 bpm), Accelerations: Reactive, and Decelerations: Absent Uterine activity: Frequency: q1-4 mins  Labs: Lab Results  Component Value Date   WBC 8.1 08/13/2020   HGB 11.5 (L) 08/13/2020   HCT 33.9 (L) 08/13/2020   MCV 93.6 08/13/2020   PLT 146 (L) 08/13/2020    Patient Active Problem List   Diagnosis Date Noted   Elevated BP without diagnosis of hypertension 08/12/2020   Chlamydia infection affecting pregnancy 04/22/2020   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 Plan to give another cytotec (1850) then reassess in 4 hrs for FB placement. #elevated pressure reading wo dx of gHTN: Had elevated BP in clinic 10/13 to 134/92 and given her gestational age she was scheduled for IOL. Currently normotensive, will continue to monitor. PEC labs pending. #Pain: per request, desires epidural #FWB:  cat 1 #ID: [id number] #anticipated del: VD  11/13 MD, PGY-1 Family Medicine Resident, [facility] Faculty Teaching Service  08/13/2020, 7:37 PM

## 2020-08-13 NOTE — H&P (Addendum)
OBSTETRIC ADMISSION HISTORY AND PHYSICAL  Carla Flores is a 19 y.o. female G1P0 with IUP at [redacted]w[redacted]d by LMP presenting for IOL for elevated blood pressure in clinic (not above 140/90). Reports that she does not feel contractions. She reports +FMs, No LOF, no VB, no blurry vision, headaches or peripheral edema, and RUQ pain.  She plans on bottle feeding. She declines birth control. 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 Medical history non-contributory     Past Surgical History: Past Surgical History:  Procedure Laterality Date  . NO PAST SURGERIES      Obstetrical History: OB History    Gravida  1   Para      Term      Preterm      AB      Living        SAB      TAB      Ectopic      Multiple      Live Births              Social History Social History   Socioeconomic History  . Marital status: Single    Spouse name: Not on file  . Number of children: Not on file  . Years of education: Not on file  . Highest education level: Not on file  Occupational History  . Not on file  Tobacco Use  . Smoking status: Never Smoker  . Smokeless tobacco: Never Used  Vaping Use  . Vaping Use: Never used  Substance and Sexual Activity  . Alcohol use: Never  . Drug use: Never  . Sexual activity: Yes    Partners: Male    Comment: [place]   Other Topics Concern  . Not on file  Social History Narrative  . Not on file   Social Determinants of Health   Financial Resource Strain:   . Difficulty of Paying Living Expenses: Not on file  Food Insecurity:   . Worried About 04/23/20 in the Last Year: Not on file  . Ran Out of Food in the Last Year: Not on file  Transportation Needs:   . Lack of Transportation (Medical): Not on file  . Lack of Transportation (Non-Medical): Not on file  Physical Activity:   . Days of Exercise per Week: Not on file  . Minutes of Exercise per Session: Not on file  Stress:   . Feeling of Stress : Not on file  Social Connections:   . Frequency of Communication with Friends and Family: Not on file  . Frequency of Social Gatherings with Friends and Family: Not on file  . Attends Religious Services: Not on file  . Active Member of Clubs or Organizations: Not on file  . Attends Community education officer Meetings: Not on file  . Marital Status: Not on file    Family History: Family History  Problem Relation Age of Onset  . Diabetes Maternal Grandmother     Allergies: No Known Allergies  Medications Prior to Admission  Medication Sig Dispense Refill Last Dose  . Blood Pressure Monitoring (BLOOD PRESSURE CUFF) MISC 1 Device by Does  not apply route once a week. 1 each 0   . Doxylamine-Pyridoxine (DICLEGIS) 10-10 MG TBEC Take 2 tablets by mouth at bedtime. If symptoms persist, add one tablet in the morning and one in the afternoon (Patient not taking: Reported on 07/31/2020) 100 tablet 5   . Elastic Bandages & Supports (COMFORT FIT MATERNITY SUPP SM) MISC Wear as directed. 1 each 0   . ondansetron (ZOFRAN ODT) 4 MG disintegrating tablet Take 1 tablet (4 mg total) by mouth every 6 (six) hours as needed for nausea. (Patient not taking: Reported on 07/31/2020) 20 tablet 0   . Prenatal Vit-Fe Fumarate-FA (PREPLUS) 27-1 MG TABS Take 1 tablet by mouth daily. 30 tablet 13      Review of Systems   All systems reviewed and negative except as stated in HPI  Height 5\' 9"  (1.753 m), weight 68.5 kg, last menstrual period 11/07/2019. General appearance: alert, cooperative and no distress Lungs: breathing comfortably Heart: regular rate and  rhythm Abdomen: gravid Pelvic: see below Extremities: no sign of DVT Presentation: cephalic by bedside 01/05/2020 Fetal monitoringBaseline: 140 bpm, Variability: Good {> 6 bpm), Accelerations: Reactive and Decelerations: Absent Uterine activityFrequency: Every 2-4 minutes     Prenatal labs: ABO, Rh: --/--/PENDING (10/14 1253) Antibody: PENDING (10/14 1253) Rubella: 2.76 (04/28 1307) RPR: Non Reactive (07/30 1121)  HBsAg: Negative (04/28 1307)  HIV: Non Reactive (07/30 1121)  GBS: Negative/-- (09/17 1140)  2 hr Glucola: 77/93/94 Genetic screening: AFP neg, intermediate allele for fragile X (see [facility] report) Anatomy 01-30-1985: normal  Prenatal Transfer Tool  Maternal Diabetes: No Genetic Screening: Abnormal:  Results: Other: Maternal Ultrasounds/Referrals: Other:  intermediate allele for fragile X (see [facility] report on 02/26/20 in maternal chart under lab section) Fetal Ultrasounds or other Referrals:  None Maternal Substance Abuse:  No Significant Maternal Medications:  None Significant Maternal Lab Results: Group B Strep negative  Results for orders placed or performed during the hospital encounter of 08/13/20 (from the past 24 hour(s))  CBC   Collection Time: 08/13/20 12:34 PM  Result Value Ref Range   WBC 8.1 4.0 - 10.5 K/uL   RBC 3.62 (L) 3.87 - 5.11 MIL/uL   Hemoglobin 11.5 (L) 12.0 - 15.0 g/dL   HCT 08/15/20 (L) 36 - 46 %   MCV 93.6 80.0 - 100.0 fL   MCH 31.8 26.0 - 34.0 pg   MCHC 33.9 30.0 - 36.0 g/dL   RDW 74.1 28.7 - 86.7 %   Platelets 146 (L) 150 - 400 K/uL   nRBC 0.0 0.0 - 0.2 %  Type and screen   Collection Time: 08/13/20 12:53 PM  Result Value Ref Range   ABO/RH(D) PENDING    Antibody Screen PENDING    Sample Expiration      08/16/2020,2359 Performed at [facility], [address] [place], [place]     Patient Active Problem List   Diagnosis Date Noted  . Elevated BP without diagnosis of hypertension 08/12/2020  . Chlamydia infection affecting  pregnancy 04/22/2020  . 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 Encounter for supervision of normal first pregnancy in second trimester 02/19/2020    Assessment/Plan:  [name] is a 19 y.o. G1P0 at [redacted]w[redacted]d here for IOL for elevated pressure reading wo dx of gHTN.  #Labor: Closed/thick/high. Plan to start with cytotec then attempt placement of FB. #elevated pressure reading wo dx of gHTN: Had elevated BP in clinic 10/13 to 134/92 and given her gestational age she was  scheduled for IOL. Currently normotensive, will continue to monitor. PEC labs pending. #Pain: per request, desires epidural #FWB:  cat 1 #ID: GBS neg #MOF: bottle #MOC: declines #Circ: N/A, girl  Herby Abraham MD, PGY-1  GME ATTESTATION:  I saw and evaluated the patient. I agree with the findings and the plan of care as documented in the resident's note.  Alric Seton, MD OB Fellow, Faculty [facility], Center for [facility] 08/13/2020 5:09 PM

## 2020-08-14 ENCOUNTER — Inpatient Hospital Stay (HOSPITAL_COMMUNITY): Payer: Medicaid Other | Admitting: Anesthesiology

## 2020-08-14 ENCOUNTER — Encounter (HOSPITAL_COMMUNITY): Payer: Self-pay | Admitting: Obstetrics & Gynecology

## 2020-08-14 DIAGNOSIS — R030 Elevated blood-pressure reading, without diagnosis of hypertension: Secondary | ICD-10-CM

## 2020-08-14 DIAGNOSIS — Z3A40 40 weeks gestation of pregnancy: Secondary | ICD-10-CM

## 2020-08-14 DIAGNOSIS — O26893 Other specified pregnancy related conditions, third trimester: Secondary | ICD-10-CM

## 2020-08-14 LAB — CBC
HCT: 33.6 % — ABNORMAL LOW (ref 36.0–46.0)
HCT: 31.3 % — ABNORMAL LOW (ref 36.0–46.0)
Hemoglobin: 11.3 g/dL — ABNORMAL LOW (ref 12.0–15.0)
Hemoglobin: 10.5 g/dL — ABNORMAL LOW (ref 12.0–15.0)
MCH: 31.2 pg (ref 26.0–34.0)
MCH: 31.3 pg (ref 26.0–34.0)
MCHC: 33.6 g/dL (ref 30.0–36.0)
MCHC: 33.5 g/dL (ref 30.0–36.0)
MCV: 92.8 fL (ref 80.0–100.0)
MCV: 93.2 fL (ref 80.0–100.0)
Platelets: 146 10*3/uL — ABNORMAL LOW (ref 150–400)
Platelets: 131 10*3/uL — ABNORMAL LOW (ref 150–400)
RBC: 3.62 MIL/uL — ABNORMAL LOW (ref 3.87–5.11)
RBC: 3.36 MIL/uL — ABNORMAL LOW (ref 3.87–5.11)
RDW: 13.2 % (ref 11.5–15.5)
RDW: 13.2 % (ref 11.5–15.5)
WBC: 10.3 10*3/uL (ref 4.0–10.5)
WBC: 18.7 10*3/uL — ABNORMAL HIGH (ref 4.0–10.5)
nRBC: 0 % (ref 0.0–0.2)
nRBC: 0 % (ref 0.0–0.2)

## 2020-08-14 LAB — RPR: RPR Ser Ql: NONREACTIVE

## 2020-08-14 MED ORDER — PHENYLEPHRINE 40 MCG/ML (10ML) SYRINGE FOR IV PUSH (FOR BLOOD PRESSURE SUPPORT)
80.0000 ug | PREFILLED_SYRINGE | INTRAVENOUS | Status: DC | PRN
  Filled 2020-08-14: qty 10

## 2020-08-14 MED ORDER — DIBUCAINE (PERIANAL) 1 % EX OINT: 1.0000 "application " | TOPICAL_OINTMENT | CUTANEOUS | Status: DC | PRN

## 2020-08-14 MED ORDER — SIMETHICONE 80 MG PO CHEW: 80.0000 mg | CHEWABLE_TABLET | ORAL | Status: DC | PRN

## 2020-08-14 MED ORDER — FENTANYL CITRATE (PF) 100 MCG/2ML IJ SOLN
INTRAMUSCULAR | Status: AC
  Filled 2020-08-14: qty 2

## 2020-08-14 MED ORDER — BENZOCAINE-MENTHOL 20-0.5 % EX AERO
1.0000 "application " | INHALATION_SPRAY | CUTANEOUS | Status: DC | PRN
  Filled 2020-08-14: qty 56

## 2020-08-14 MED ORDER — FENTANYL-BUPIVACAINE-NACL 0.5-0.125-0.9 MG/250ML-% EP SOLN
12.0000 mL/h | EPIDURAL | Status: DC | PRN
  Filled 2020-08-14: qty 250

## 2020-08-14 MED ORDER — MISOPROSTOL 200 MCG PO TABS: 1000.0000 ug | ORAL_TABLET | Freq: Once | ORAL | Status: AC

## 2020-08-14 MED ORDER — IBUPROFEN 600 MG PO TABS
600.0000 mg | ORAL_TABLET | Freq: Four times a day (QID) | ORAL | Status: DC
  Administered 2020-08-14 – 2020-08-16 (×7): 600 mg via ORAL
  Filled 2020-08-14 (×7): qty 1

## 2020-08-14 MED ORDER — TRANEXAMIC ACID 1000 MG/10ML IV SOLN: 1.5000 mg/kg/h | Freq: Once | INTRAVENOUS | Status: DC

## 2020-08-14 MED ORDER — EPHEDRINE 5 MG/ML INJ: 10.0000 mg | INTRAVENOUS | Status: DC | PRN

## 2020-08-14 MED ORDER — COCONUT OIL OIL: 1.0000 "application " | TOPICAL_OIL | Status: DC | PRN

## 2020-08-14 MED ORDER — DIPHENHYDRAMINE HCL 25 MG PO CAPS: 25.0000 mg | ORAL_CAPSULE | Freq: Four times a day (QID) | ORAL | Status: DC | PRN

## 2020-08-14 MED ORDER — ONDANSETRON HCL 4 MG PO TABS: 4.0000 mg | ORAL_TABLET | ORAL | Status: DC | PRN

## 2020-08-14 MED ORDER — ACETAMINOPHEN 325 MG PO TABS
650.0000 mg | ORAL_TABLET | Freq: Four times a day (QID) | ORAL | Status: DC | PRN
  Administered 2020-08-15 (×4): 650 mg via ORAL
  Filled 2020-08-14 (×4): qty 2

## 2020-08-14 MED ORDER — MISOPROSTOL 200 MCG PO TABS
ORAL_TABLET | ORAL | Status: AC
  Administered 2020-08-14: 800 ug via RECTAL
  Filled 2020-08-14: qty 5

## 2020-08-14 MED ORDER — ONDANSETRON HCL 4 MG/2ML IJ SOLN: 4.0000 mg | INTRAMUSCULAR | Status: DC | PRN

## 2020-08-14 MED ORDER — WITCH HAZEL-GLYCERIN EX PADS
1.0000 "application " | MEDICATED_PAD | CUTANEOUS | Status: DC | PRN
  Administered 2020-08-15: 1 via TOPICAL

## 2020-08-14 MED ORDER — SENNOSIDES-DOCUSATE SODIUM 8.6-50 MG PO TABS
2.0000 | ORAL_TABLET | ORAL | Status: DC
  Administered 2020-08-14 – 2020-08-15 (×2): 2 via ORAL
  Filled 2020-08-14 (×2): qty 2

## 2020-08-14 MED ORDER — OXYCODONE HCL 5 MG PO TABS
5.0000 mg | ORAL_TABLET | ORAL | Status: DC | PRN
  Administered 2020-08-15 (×4): 5 mg via ORAL
  Filled 2020-08-14 (×5): qty 1

## 2020-08-14 MED ORDER — PRENATAL MULTIVITAMIN CH
1.0000 | ORAL_TABLET | Freq: Every day | ORAL | Status: DC
  Administered 2020-08-15 – 2020-08-16 (×2): 1 via ORAL
  Filled 2020-08-14 (×2): qty 1

## 2020-08-14 MED ORDER — PHENYLEPHRINE 40 MCG/ML (10ML) SYRINGE FOR IV PUSH (FOR BLOOD PRESSURE SUPPORT): 80.0000 ug | PREFILLED_SYRINGE | INTRAVENOUS | Status: DC | PRN

## 2020-08-14 MED ORDER — SODIUM CHLORIDE (PF) 0.9 % IJ SOLN
INTRAMUSCULAR | Status: DC | PRN
  Administered 2020-08-14: 12 mL/h via EPIDURAL

## 2020-08-14 MED ORDER — FENTANYL CITRATE (PF) 100 MCG/2ML IJ SOLN
50.0000 ug | INTRAMUSCULAR | Status: DC | PRN
  Administered 2020-08-14: 100 ug via INTRAVENOUS

## 2020-08-14 MED ORDER — ZOLPIDEM TARTRATE 5 MG PO TABS: 5.0000 mg | ORAL_TABLET | Freq: Every evening | ORAL | Status: DC | PRN

## 2020-08-14 MED ORDER — VITAMIN C 250 MG PO TABS
250.0000 mg | ORAL_TABLET | Freq: Every day | ORAL | Status: DC
  Administered 2020-08-15 – 2020-08-16 (×2): 250 mg via ORAL
  Filled 2020-08-14 (×2): qty 1

## 2020-08-14 MED ORDER — LACTATED RINGERS IV SOLN: 500.0000 mL | Freq: Once | INTRAVENOUS | Status: DC

## 2020-08-14 MED ORDER — TETANUS-DIPHTH-ACELL PERTUSSIS 5-2.5-18.5 LF-MCG/0.5 IM SUSP: 0.5000 mL | Freq: Once | INTRAMUSCULAR | Status: DC

## 2020-08-14 MED ORDER — DIPHENHYDRAMINE HCL 50 MG/ML IJ SOLN: 12.5000 mg | INTRAMUSCULAR | Status: DC | PRN

## 2020-08-14 MED ORDER — TRANEXAMIC ACID-NACL 1000-0.7 MG/100ML-% IV SOLN
INTRAVENOUS | Status: AC
  Filled 2020-08-14: qty 100

## 2020-08-14 MED ORDER — TRANEXAMIC ACID-NACL 1000-0.7 MG/100ML-% IV SOLN: 1000.0000 mg | Freq: Once | INTRAVENOUS | Status: AC

## 2020-08-14 MED ORDER — FERROUS SULFATE 325 (65 FE) MG PO TABS
325.0000 mg | ORAL_TABLET | Freq: Every day | ORAL | Status: DC
  Administered 2020-08-15 – 2020-08-16 (×2): 325 mg via ORAL
  Filled 2020-08-14 (×2): qty 1

## 2020-08-14 MED ORDER — LIDOCAINE HCL (PF) 1 % IJ SOLN
INTRAMUSCULAR | Status: DC | PRN
  Administered 2020-08-14: 8 mL via EPIDURAL

## 2020-08-14 NOTE — Progress Notes (Signed)
LABOR PROGRESS NOTE  Carla Flores is a 19 y.o. G1P0 at [redacted]w[redacted]d admitted for IOL for gHTN  Subjective: Patient feeling contractions occasionally, trying to sleep.   Objective: BP 110/76   Pulse 87   Temp 98.6 F (37 C) (Oral)   Resp 16   Ht 5\' 9"  (1.753 m)   Wt 68.5 kg   LMP 11/07/2019 (Exact Date)   BMI 22.30 kg/m    SVE deferred, foley balloon in place  Fetal monitoring: Baseline: 150 bpm, Variability: mod variability Accelerations: present, and Decelerations: Absent Uterine activity: Frequency: irritable, q3-4 mins  Labs: Lab Results  Component Value Date   WBC 8.1 08/13/2020   HGB 11.5 (L) 08/13/2020   HCT 33.9 (L) 08/13/2020   MCV 93.6 08/13/2020   PLT 146 (L) 08/13/2020    Patient Active Problem List   Diagnosis Date Noted  . Elevated BP without diagnosis of hypertension 08/12/2020  . Chlamydia infection affecting pregnancy 04/22/2020  . 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 Encounter for supervision of normal first pregnancy in second trimester 02/19/2020    Assessment / Plan: IOL for gHTN  #Labor: Initial SVE closed/thick high, now 1/50/-2. S/p Cytotec x3. FB placed at 2300, still in place. Continue with cytotec at this time.     #elevated pressure reading wo dx of gHTN: Had elevated BP in clinic 10/13 to 134/92 and given her gestational age she was scheduled for IOL. Currently normotensive, will continue to monitor. PEC labs normal.  #Pain: per request, desires epidural #FWB:  cat 1 #ID: GBS [id number] #anticipated del: VD   11/13, MD OB Family Medicine Fellow, [facility] for [facility], [facility] 08/14/2020, 3:31 AM

## 2020-08-14 NOTE — Progress Notes (Signed)
Carla Flores is a 19 y.o. G1P0 at [redacted]w[redacted]d  Subjective: Patient denies pain, denies urge to push. Mother and FOB at bedside.  Objective: BP 116/82   Pulse (!) 106   Temp 99.3 F (37.4 C) (Axillary)   Resp 16   Ht 5\' 9"  (1.753 m)   Wt 68.5 kg   LMP 11/07/2019 (Exact Date)   SpO2 100%   BMI 22.30 kg/m  No intake/output data recorded. Total I/O In: -  Out: 550 [Urine:550]  FHT:  FHR: 140 bpm, variability: moderate,  accelerations:  Present,  decelerations:  Absent UC:   regular, every 2-3 minutes SVE:   Dilation: 10 Effacement (%): 80 Station: -2 Exam by:: Sam, CNM  Labs: Lab Results  Component Value Date   WBC 10.3 08/14/2020   HGB 11.3 (L) 08/14/2020   HCT 33.6 (L) 08/14/2020   MCV 92.8 08/14/2020   PLT 146 (L) 08/14/2020    Assessment / Plan: --19 y.o. G1P0 at [redacted]w[redacted]d  --Cat I tracing --Anxiety, maternal tachycardia triggered by reclined positions --AROM clear fluid, not 10/100/+1 --No fetal descent with practice pushes --Will labor down until 1630 (one hour) --Anticipate NSVD  [redacted]w[redacted]d, CNM 08/14/2020, 3:45 PM

## 2020-08-14 NOTE — Progress Notes (Signed)
LABOR PROGRESS NOTE  Carla Flores is a 19 y.o. G1P0 at [redacted]w[redacted]d admitted for IOL for gHTN  Subjective: Epidural being placed.   Objective: BP (!) 109/57    Pulse 85    Temp 98 F (36.7 C) (Oral)    Resp 16    Ht 5\' 9"  (1.753 m)    Wt 68.5 kg    LMP 11/07/2019 (Exact Date)    BMI 22.30 kg/m  SVE deferred, foley balloon in place  Fetal monitoring: Baseline: 150 bpm, Variability: mod variability Accelerations: present, and Decelerations: Absent Uterine activity: Frequency: irritable, q2-3 mins  Assessment / Plan: IOL for gHTN  #Labor: last check 1/50/-2 & FB remains in place. S/p Cytotec x3 (2333). #Elevated pressure reading wo dx of gHTN: Had elevated BP in clinic 10/13 to 134/92 and given her gestational age she was scheduled for IOL. PEC labs normal. Continues to be normotensive.BP: (106-123)/(57-90) 109/57 (10/15 0544). Continue to monitor.   #Pain: obtaining epidural  #FWB:  cat 1 #ID: GBS neg #anticipated del: VD  09-11-2005, MD 08/14/2020, 7:26 AM

## 2020-08-14 NOTE — Anesthesia Preprocedure Evaluation (Signed)
Anesthesia Evaluation  Patient identified by MRN, date of birth, ID band Patient awake    Reviewed: Allergy & Precautions, NPO status , Patient's Chart, lab work & pertinent test results  Airway Mallampati: II  TM Distance: >3 FB Neck ROM: Full    Dental no notable dental hx.    Pulmonary neg pulmonary ROS,    Pulmonary exam normal breath sounds clear to auscultation       Cardiovascular negative cardio ROS Normal cardiovascular exam Rhythm:Regular Rate:Normal     Neuro/Psych negative neurological ROS  negative psych ROS   GI/Hepatic negative GI ROS, Neg liver ROS,   Endo/Other  negative endocrine ROS  Renal/GU negative Renal ROS  negative genitourinary   Musculoskeletal negative musculoskeletal ROS (+)   Abdominal   Peds negative pediatric ROS (+)  Hematology negative hematology ROS (+)   Anesthesia Other Findings   Reproductive/Obstetrics (+) Pregnancy                             Anesthesia Physical Anesthesia Plan  ASA: II  Anesthesia Plan: Epidural   Post-op Pain Management:    Induction:   PONV Risk Score and Plan: 2  Airway Management Planned:   Additional Equipment:   Intra-op Plan:   Post-operative Plan:   Informed Consent: I have reviewed the patients History and Physical, chart, labs and discussed the procedure including the risks, benefits and alternatives for the proposed anesthesia with the patient or authorized representative who has indicated his/her understanding and acceptance.       Plan Discussed with: Anesthesiologist  Anesthesia Plan Comments:         Anesthesia Quick Evaluation

## 2020-08-14 NOTE — Anesthesia Procedure Notes (Signed)
Epidural Patient location during procedure: OB Start time: 08/14/2020 7:30 AM End time: 08/14/2020 7:37 AM  Staffing Anesthesiologist: Mellody Dance, MD Performed: anesthesiologist   Preanesthetic Checklist Completed: patient identified, IV checked, site marked, risks and benefits discussed, monitors and equipment checked, pre-op evaluation and timeout performed  Epidural Patient position: sitting Prep: DuraPrep Patient monitoring: heart rate, cardiac monitor, continuous pulse ox and blood pressure Approach: midline Location: L3-L4 Injection technique: LOR saline  Needle:  Needle type: Tuohy  Needle gauge: 17 G Needle length: 9 cm Needle insertion depth: 7 cm Catheter type: closed end flexible Catheter size: 20 Guage Catheter at skin depth: 12 cm Test dose: negative and Other  Assessment Events: blood not aspirated, injection not painful, no injection resistance and negative IV test  Additional Notes Informed consent obtained prior to proceeding including risk of failure, 1% risk of PDPH, risk of minor discomfort and bruising.  Discussed rare but serious complications including epidural abscess, permanent nerve injury, epidural hematoma.  Discussed alternatives to epidural analgesia and patient desires to proceed.  Timeout performed pre-procedure verifying patient name, procedure, and platelet count.  Patient tolerated procedure well.

## 2020-08-14 NOTE — Progress Notes (Addendum)
LABOR PROGRESS NOTE  Carla Flores is a 19 y.o. G1P0 at [redacted]w[redacted]d admitted for IOL for gHTN  Subjective: Reports improvement of pain with epidural. Resting comfortably.  Objective: BP 105/66   Pulse (!) 108   Temp 99.6 F (37.6 C) (Axillary)   Resp 16   Ht 5\' 9"  (1.753 m)   Wt 68.5 kg   LMP 11/07/2019 (Exact Date)   SpO2 100%   BMI 22.30 kg/m  SVE deferred, foley balloon in place  Fetal monitoring: Baseline: 150 bpm, Variability: mod variability Accelerations: present, and Decelerations: Absent Uterine activity: Frequency: irritable, q2-3 mins  Assessment / Plan: IOL for elevated BP wo dx of gHTN.  #Labor: Contracting regularly. S/p Cytotec x3 (2333). S/p FB. Hold on pit at this point due to reports of regular, painful contractions from patient prior to epidural placement and evidence of regular contractions on toco.  #Elevated pressure reading wo dx of gHTN: Had elevated BP in clinic 10/13 to 134/92 and given her gestational age she was scheduled for IOL. PEC labs normal. Continues to be normotensive.BP. Continue to monitor.  #Pain: epidural placed  #FWB:  cat 1 #ID: GBS neg #anticipated del: VD  11/13, MD PGY1 08/14/2020, 9:20 AM  Attestation of Supervision of Student:  I confirm that I have verified the information documented in the  resident's  note and that I have also personally reperformed the history, physical exam and all medical decision making activities.  I have verified that all services and findings are accurately documented in this student's note; and I agree with management and plan as outlined in the documentation. I have also made any necessary editorial changes.  08/16/2020, MD Center for [facility], [facility] 08/14/2020 8:38 PM

## 2020-08-14 NOTE — Discharge Summary (Addendum)
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 yo G1P1001 at [redacted]w[redacted]d was admitted to the hospital 08/13/2020 for induction of labor.  Indication for induction: elevated BP in office, without diagnosis of gestational hypertension.  Patient had an uncomplicated labor course as follows: Membrane Rupture Time/Date: 3:28 PM ,08/14/2020   Delivery Method:Vaginal, Spontaneous  Episiotomy: None  Lacerations:  Sulcus;2nd degree;Labial  Details of delivery can be found in separate delivery note.  Patient had a routine postpartum course. Patient is discharged home 08/16/20.  Newborn Data: Birth date:08/14/2020  Birth time:6:56 PM  Gender:Female  Living status:Living  Apgars:9 ,9  Weight:3385 g   Magnesium Sulfate received: No BMZ received: No Rhophylac:No MMR:No T-DaP:declined prenatally Flu: No, declined prenatally  Transfusion:No  Physical exam  Vitals:   08/15/20 0959 08/15/20 1425 08/15/20 2025 08/16/20 0553  BP: 106/80 115/75 119/86 104/72  Pulse: 88 83 86 78  Resp: $Remo'20 20 18 16  'BpniW$ Temp: 98.2 F (36.8 C) 98.2 F (36.8 C) 98 F (36.7 C) 98.8 F (37.1 C)   TempSrc: Oral Oral Oral Oral  SpO2: 100%  100% 100%  Weight:      Height:       General: alert, cooperative and no distress Lochia: appropriate Uterine Fundus: firm Incision: N/A DVT Evaluation: No evidence of DVT seen on physical exam. Labs: Lab Results  Component Value Date   WBC 20.1 (H) 08/15/2020   HGB 10.0 (L) 08/15/2020   HCT 29.5 (L) 08/15/2020   MCV 94.9 08/15/2020   PLT 126 (L) 08/15/2020   CMP Latest Ref Rng & Units 08/13/2020  Glucose 70 - 99 mg/dL 87  BUN 6 - 20 mg/dL <5(L)  Creatinine 0.44 - 1.00 mg/dL 0.71  Sodium 135 - 145 mmol/L 136  Potassium 3.5 - 5.1 mmol/L 4.2  Chloride 98 - 111 mmol/L 107  CO2 22 - 32 mmol/L 21(L)  Calcium 8.9 - 10.3 mg/dL 8.9  Total Protein 6.5 - 8.1 g/dL 6.3(L)  Total Bilirubin 0.3 - 1.2 mg/dL 0.6  Alkaline Phos 38 - 126 U/L 161(H)  AST 15 - 41 U/L 18  ALT 0 - 44 U/L 10   Edinburgh Score: Edinburgh Postnatal Depression Scale Screening Tool 08/15/2020  I have been able to laugh and see the funny side of things. 0  I have looked forward with enjoyment to things. 0  I have blamed myself unnecessarily when things went wrong. 0  I have been anxious or worried for no good reason. 0  I have felt scared or panicky for no good reason.  1  Things have been getting on top of me. 0  I have been so unhappy that I have had difficulty sleeping. 0  I have felt sad or miserable. 0  I have been so unhappy that I have been crying. 0  The thought of harming myself has occurred to me. 0  Edinburgh Postnatal Depression Scale Total 1     After visit meds:  Allergies as of 08/16/2020   No Known Allergies     Medication List    STOP taking these medications   Blood Pressure Cuff Misc   Doxylamine-Pyridoxine 10-10 MG Tbec Commonly known as: Diclegis   ondansetron 4 MG disintegrating tablet Commonly known as: Zofran ODT     TAKE these medications   acetaminophen 325 MG tablet Commonly known as: Tylenol Take 2 tablets (650 mg total)  by mouth every 6 (six) hours as needed (for pain scale < 4).   [place] Supp Sm Misc Wear as directed.   ibuprofen 600 MG tablet Commonly known as: ADVIL Take 1 tablet (600 mg total) by mouth every 6 (six) hours.   PrePLUS 27-1 MG Tabs Take 1 tablet by mouth daily.        Discharge home in stable condition Infant Feeding: Breast Infant Disposition:home with mother Discharge instruction: per After Visit Summary and Postpartum booklet. Activity: Advance as tolerated. Pelvic rest for 6 weeks.  Diet: routine diet Future Appointments:No future appointments. Follow up Visit:  [place]. Schedule an appointment as soon as possible for a visit in 1 week(s).   Why: 1 week for BP check 4-6 weeks for PP visit Contact information: [address] [place] [address] [phone]              Please schedule this patient for a In person postpartum visit in 6 weeks with the following provider: Any provider. Patient needs a pap at visit. One week BP check. Additional Postpartum F/U:none  Low risk pregnancy complicated by: new onset elevated BP without diagnosis of HTN Delivery mode:  Vaginal, Spontaneous  Anticipated Birth Control:  Condoms   08/16/2020 [name], NP    Attestation of Supervision of Student:  I confirm that I have verified the information documented in the medical student's note and that I have also personally reperformed the history, physical exam and all medical decision making activities.  I have verified that all services and findings are accurately documented in this student's note; and I agree with management and plan as outlined in the documentation. I have also made any necessary editorial changes.  [name], NP [facility] for [employer] [facility], [facility] 08/16/2020 11:05 AM

## 2020-08-15 LAB — CBC
HCT: 29.5 % — ABNORMAL LOW (ref 36.0–46.0)
Hemoglobin: 10 g/dL — ABNORMAL LOW (ref 12.0–15.0)
MCH: 32.2 pg (ref 26.0–34.0)
MCHC: 33.9 g/dL (ref 30.0–36.0)
MCV: 94.9 fL (ref 80.0–100.0)
Platelets: 126 10*3/uL — ABNORMAL LOW (ref 150–400)
RBC: 3.11 MIL/uL — ABNORMAL LOW (ref 3.87–5.11)
RDW: 13.3 % (ref 11.5–15.5)
WBC: 20.1 10*3/uL — ABNORMAL HIGH (ref 4.0–10.5)
nRBC: 0 % (ref 0.0–0.2)

## 2020-08-15 NOTE — Anesthesia Postprocedure Evaluation (Signed)
Anesthesia Post Note  Patient: Najmo Pardue Ahlberg  Procedure(s) Performed: AN AD HOC LABOR EPIDURAL     Patient location during evaluation: Mother Baby Anesthesia Type: Epidural Level of consciousness: awake and alert Pain management: pain level controlled Vital Signs Assessment: post-procedure vital signs reviewed and stable Respiratory status: spontaneous breathing Cardiovascular status: stable Postop Assessment: no headache, adequate PO intake, no backache, patient able to bend at knees, able to ambulate, no apparent nausea or vomiting and epidural receding Anesthetic complications: no   No complications documented.  Last Vitals:  Vitals:   08/14/20 2338 08/15/20 0246  BP: (!) 125/92 108/78  Pulse: 99 81  Resp: 16 17  Temp: 36.9 C 36.8 C  SpO2: 100% 100%    Last Pain:  Vitals:   08/15/20 0740  TempSrc:   PainSc: Asleep   Pain Goal: Patients Stated Pain Goal: 2 (08/15/20 0246)                 Salome Arnt

## 2020-08-15 NOTE — Progress Notes (Addendum)
Postpartum Day 1  Subjective Up ad lib, voiding, tolerating PO, and + flatus. Denies dizziness, lightheadedness, tachycardia. Appropriate Lochia. Pain well controlled.  Objective Temp:  [98.2 F (36.8 C)-99.6 F (37.6 C)] 98.2 F (36.8 C) (10/16 0246) Pulse Rate:  [81-226] 81 (10/16 0246) Resp:  [16-18] 17 (10/16 0246) BP: (94-137)/(51-93) 108/78 (10/16 0246) SpO2:  [98 %-100 %] 100 % (10/16 0246) Intake/Output      10/15 0701 - 10/16 0700   Urine (mL/kg/hr) 1750 (1.1)   Blood 875   Total Output 2625   Net -2625       Urine Occurrence 1 x     General: alert, cooperative, NAD Uterine Fundus: firm MSK: moving extremities spontaneously. Warm and well perfused. No signs of DVT.   Recent Labs    08/13/20 1234 08/14/20 0639 08/14/20 2122  HGB 11.5* 11.3* 10.5*  HCT 33.9* 33.6* 31.3*   Assessment & Plan Postpartum Day # 1 . Plan for d/c tomorrow  . Pain: scheduled ibuprofen. PRN tylenol & oxycodone . Feed: bottle  . Contraception: declines    Elevated BP prior to L&D BP stable with no elevations.  . Continue to monitor.   PPH   EBL 827 cc. S/p cytotec, pitocin, TXA. Asymptomatic this morning. 11.3>10.5>10.0 this AM . PO iron q breakfast     LOS: 2 days   Melene Plan 08/15/2020, 6:56 AM   Attestation of Supervision of Student:  I confirm that I have verified the information documented in the resident's note and that I have also personally reperformed the history, physical exam and all medical decision making activities.  I have verified that all services and findings are accurately documented in this student's note; and I agree with management and plan as outlined in the documentation. I have also made any necessary editorial changes.  --Discussed breast engorgement, interventions for relief --FOB at bedside and engaged in care  Calvert Cantor, [facility] for [employer], [facility] 08/15/2020 10:26 AM

## 2020-08-16 MED ORDER — ACETAMINOPHEN 325 MG PO TABS: 650.0000 mg | ORAL_TABLET | Freq: Four times a day (QID) | ORAL | 1 refills | Status: DC | PRN

## 2020-08-16 MED ORDER — IBUPROFEN 600 MG PO TABS: 600.0000 mg | ORAL_TABLET | Freq: Four times a day (QID) | ORAL | 1 refills | Status: DC

## 2020-08-16 NOTE — Discharge Summary (Signed)
Duplicate opened in error.  Logen Fowle, Odie Sera, NP  10:50 AM 08/16/2020

## 2020-08-16 NOTE — Discharge Instructions (Signed)

## 2020-08-21 ENCOUNTER — Ambulatory Visit: Payer: Medicaid Other

## 2020-08-27 ENCOUNTER — Telehealth: Payer: Self-pay

## 2020-08-27 NOTE — Telephone Encounter (Signed)
[facility] nurse called and reported this patients EPDS was a 16 when she spoke with her last night. I called pt to evaluate and set her up with appt with Sue Lush. Pt did not answer, LVM for pt to call back.

## 2020-08-31 ENCOUNTER — Ambulatory Visit: Payer: Medicaid Other

## 2020-09-17 ENCOUNTER — Ambulatory Visit (INDEPENDENT_AMBULATORY_CARE_PROVIDER_SITE_OTHER): Payer: Medicaid Other

## 2020-09-17 ENCOUNTER — Other Ambulatory Visit (HOSPITAL_COMMUNITY)
Admission: RE | Admit: 2020-09-17 | Discharge: 2020-09-17 | Disposition: A | Discharge status: Home / Self Care | Payer: Medicaid Other | Source: Ambulatory Visit

## 2020-09-17 ENCOUNTER — Other Ambulatory Visit: Payer: Self-pay

## 2020-09-17 DIAGNOSIS — R87811 Vaginal high risk human papillomavirus (HPV) DNA test positive: Secondary | ICD-10-CM

## 2020-09-17 DIAGNOSIS — Z8759 Personal history of other complications of pregnancy, childbirth and the puerperium: Secondary | ICD-10-CM

## 2020-09-17 DIAGNOSIS — Z124 Encounter for screening for malignant neoplasm of cervix: Secondary | ICD-10-CM | POA: Insufficient documentation

## 2020-09-17 DIAGNOSIS — R87613 High grade squamous intraepithelial lesion on cytologic smear of cervix (HGSIL): Secondary | ICD-10-CM

## 2020-09-17 NOTE — Progress Notes (Signed)
Post Partum Visit Note  Carla Flores is a 19 y.o. G68P1001 female who presents for a postpartum visit. She is 4 weeks postpartum following a normal spontaneous vaginal delivery.  I have fully reviewed the prenatal and intrapartum course. The delivery was at 40.1 gestational weeks.  Anesthesia: epidural. Postpartum course has been complicated by vaginal pain. Baby is doing well. Baby is feeding by bottle - Gerber Gentle. Bleeding staining only. Bowel function is normal. Bladder function is normal. Patient is not sexually active. Contraception method is abstinence.  Postpartum depression screening: negative, score 0.  Patient states she has been having vaginal pain since discharge that impedes her walking.  She states the pain has improved, but is "still bad."  Patient rates the pain a 8-9/10 and takes ibuprofen every 6 hrs with no relief.  Patient denies difficulty with urination or constipation or diarrhea.   Patient reports she received support from her grandmother and FOB who is present today.    The pregnancy intention screening data noted above was reviewed. Potential methods of contraception were discussed. The patient elected to proceed with Female Condom.    Edinburgh Postnatal Depression Scale - 09/17/20 1421      Edinburgh Postnatal Depression Scale:  In the Past 7 Days   I have been able to laugh and see the funny side of things. 0    I have looked forward with enjoyment to things. 0    I have blamed myself unnecessarily when things went wrong. 0    I have been anxious or worried for no good reason. 0    I have felt scared or panicky for no good reason. 0    Things have been getting on top of me. 0    I have been so unhappy that I have had difficulty sleeping. 0    I have felt sad or miserable. 0    I have been so unhappy that I have been crying. 0    The thought of harming myself has occurred to me. 0    Edinburgh Postnatal Depression Scale Total 0             The following portions of the patient's history were reviewed and updated as appropriate: allergies, current medications, past family history, past medical history, past social history, past surgical history and problem list.  Review of Systems Pertinent items are noted in HPI.    Objective:  BP 110/85   Pulse 99   Wt 128 lb (58.1 kg)   BMI 18.90 kg/m    General:  alert, cooperative and mild distress   Breasts:  Not Examined  Lungs: clear to auscultation bilaterally  Heart:  regular rate and rhythm  Abdomen: soft, non-tender; bowel sounds normal; no masses,  no organomegaly   Vulva:  positive for swelling on the right and down to the perineum.  Vagina: vagina positive for vaginal tear/laceration healing well.  Sutures wipe away with faux swab. Mild tenderness on vaginal floor. Speculum exam with no lacerations or hematomas appreciated t/o vaginal walls/sulcus. Bimanual exam with sutures noted on vaginal floor and through hymenal ring to introitus. Some restriction of area s/t repair. Good tone  Cervix:  no bleeding following Pap and no cervical motion tenderness  Corpus: normal size, contour, position, consistency, mobility, non-tender  Adnexa:  not evaluated  Rectal Exam: Not performed.        Assessment:   4 weeks postpartum exam SVD with Repair of SVD Vaginal Pain and  Swelling Pap smear done at today's visit.  Condoms  Plan:   -Exam performed and findings discussed.  -Reviewed methods for increasing comfort including ice packs to perineum, tylenol/ibuprofen usage, and increased activity. -Instructed to call office if symptoms not improved in one week. -Pap smear completed today with great tolerance considering patient discomfort.  -Advised to continue to abstain from sexual activity until vaginal laceration heals more. -Reviewed ASCCP guidelines and office protocol for reporting of results.  Essential components of care per ACOG recommendations:  1.  Mood and  well being: Patient with negative depression screening today. Reviewed local resources for support.  - Patient does not use tobacco. - hx of drug use? No   2. Infant care and feeding:  -Patient currently breastmilk feeding? No  -Social determinants of health (SDOH) reviewed in EPIC. No concerns  3. Sexuality, contraception and birth spacing - Patient does not want a pregnancy in the next year.  Desired family size is 2 children.  - Reviewed forms of contraception in tiered fashion. Patient desired no method today and will utilize female condoms.   - Discussed birth spacing of 18 months  4. Sleep and fatigue -Encouraged family/partner/community support of 4 hrs of uninterrupted sleep to help with mood and fatigue  5. Physical Recovery  - Discussed patients delivery and complications-Patient states delivery was "long and draining." - Patient had a 2nd degree laceration, perineal healing reviewed. Patient expressed understanding - Patient has urinary incontinence? No - Patient is not safe to resume physical and sexual activity  6.  Health Maintenance - Last pap smear done today and is pending No Mammogram  7. No Chronic Disease - PCP follow up  Cherre Robins, CNM Center for [employer], [facility]

## 2020-09-19 NOTE — Patient Instructions (Signed)
Vaginal Laceration  A vaginal laceration is a cut or tear of the opening of the vagina, the inside of the vaginal canal, or the skin between the vaginal opening and the anus (perineum). What are the causes? This condition may be caused by:  Childbirth. It may also be caused by tools that are used to help deliver a baby, such as forceps.  Sex.  An injury from sports, bike riding, or other activities.  Thinning, dryness, or irritation of the vagina due to low estrogen levels (vulvovaginal atrophy). What increases the risk? You are more likely to develop this condition if you:  Give birth vaginally.  Are sexually active.  Have gone through menopause.  Have low estrogen levels due to certain medicines, breast cancer treatments, or breastfeeding. What are the signs or symptoms? Symptoms of this condition include:  Slight to heavy vaginal bleeding.  Vaginal swelling.  Mild to severe pain.  Vaginal tenderness.  Painful urination.  Pain or discomfort during sex. How is this diagnosed? If the tear happened during childbirth, your health care provider can diagnose the tear at that time. Other tears or lacerations can be diagnosed with your medical history and a physical exam. You may have other tests, including:  Blood tests to check your hormone levels and blood loss.  Imaging tests, such as an ultrasonogram or CT scan, to rule out other health issues, such as enlarged lymph nodes or tumors. How is this treated? Treatment depends on the severity of the tear or laceration. Minor injuries may heal on their own. If needed, this condition may be treated with:  Stitches (sutures).  Medicines, such as: ? Creams to reduce pain. ? Vaginal lubricants to treat vaginal dryness. ? Topical or oral hormonal therapy. ? Antibiotics. These may be taken orally or given as ointments to prevent or treat infection. Surgery may be needed if the tear is severe. Follow these instructions at  home: Wound care  Follow instructions from your health care provider about how to take care of your wound. Make sure you: ? Wash your hands with soap and water before and after you change your bandage (dressing). If soap and water are not available, use hand sanitizer. ? Change your dressing as told by your health care provider. ? Keep the area clean. ? Leave sutures in place, if this applies. These skin closures may need to stay in place for 2 weeks or longer.  Check your wound every day for signs of infection. Check for: ? Redness, swelling, or pain. ? Fluid or blood. ? Warmth. ? Pus or a bad smell. Managing pain and swelling   If directed, put ice on the injured area: ? Put ice in a plastic bag. ? Place a towel between your skin and the bag. ? Leave the ice on for 20 minutes, 2-3 times a day. Medicines  Take or apply over-the-counter and prescription medicines only as told by your health care provider.  If you were prescribed an antibiotic medicine, take it as told by your health care provider. Do not stop using the antibiotic even if you start to feel better.  Ask your health care provider if the medicine prescribed to you: ? Requires you to avoid driving or using heavy machinery. ? Can cause constipation. You may need to take actions to prevent or treat constipation, such as:  Drink enough fluid to keep your urine pale yellow.  Take over-the-counter or prescription medicines.  Eat foods that are high in fiber, such as beans,   whole grains, and fresh fruits and vegetables.  Limit foods that are high in fat and processed sugars, such as fried or sweet foods. General instructions   Take a sitz bath 2-3 times a day or as told by your health care provider. A sitz bath is a shallow, warm water bath that is taken while you are sitting down. The water should only come up to your hips and should cover your buttocks.  Avoid sitting or standing for long periods of time.  Lie on  your side while sleeping or resting.  Avoid straining during bowel movements. Ask your health care provider if a stool softener is needed.  Do not douche, use a tampon, or have sex until your health care provider approves.  Keep all follow-up visits as told by your health care provider. This is important. Contact a health care provider if:  You have: ? More redness, swelling, or pain in the vaginal area. ? More fluid or blood coming from your vaginal tear. ? Pus or a bad smell coming from your vaginal area. ? A fever. ? A tear that breaks open after it healed or was repaired. ? A burning pain when you urinate.  You continue to have pain during sex after the tear heals.  You are urinating more often than usual or feel an increased urgency to urinate. Get help right away if you:  Feel light-headed.  Have nausea or vomiting.  Have severe pain around your vagina or in your pelvis or lower abdomen.  Have heavy vaginal bleeding, or you are soaking more than 1 pad an hour. Summary  A vaginal laceration is a cut or tear of the opening of the vagina, the inside of the vaginal canal, or the skin between the vaginal opening and the anus (perineum).  It is caused by childbirth, sex, injury, or thinning, dryness, or irritation of the vagina due to low estrogen levels.  Treatment depends on the severity of the tear or laceration. Minor injuries may heal on their own. It may be treated with stitches, medicines, or surgery if the tear is severe. This information is not intended to replace advice given to you by your health care provider. Make sure you discuss any questions you have with your health care provider. Document Revised: 05/31/2018 Document Reviewed: 05/31/2018 Elsevier Patient Education  2020 Elsevier Inc.  

## 2020-09-22 LAB — CYTOLOGY - PAP
Comment: NEGATIVE
Comment: NEGATIVE
Diagnosis: HIGH — AB
HPV 16: NEGATIVE
HPV 18 / 45: NEGATIVE
High risk HPV: POSITIVE — AB

## 2020-09-23 ENCOUNTER — Telehealth: Payer: Self-pay

## 2020-09-23 NOTE — Telephone Encounter (Signed)
-----   Message from Gerrit Heck, [place] sent at 09/23/2020  9:19 AM EST ----- Please call patient and inform of need and schedule for a colposcopy.

## 2020-09-23 NOTE — Telephone Encounter (Signed)
Pt made aware of results and need for colpo Pt voiced understanding Advised no unprotected intercourse x 14 before procedure and not schedule while on cycle and may take OTC pain medication 30 mins before. Pt info given to scheduling to make appt for colpo.

## 2020-10-16 ENCOUNTER — Other Ambulatory Visit (HOSPITAL_COMMUNITY)
Admission: RE | Admit: 2020-10-16 | Discharge: 2020-10-16 | Disposition: A | Discharge status: Home / Self Care | Payer: Medicaid Other | Source: Ambulatory Visit | Attending: Obstetrics & Gynecology | Admitting: Obstetrics & Gynecology

## 2020-10-16 ENCOUNTER — Other Ambulatory Visit: Payer: Self-pay

## 2020-10-16 ENCOUNTER — Encounter: Payer: Self-pay | Admitting: Obstetrics & Gynecology

## 2020-10-16 ENCOUNTER — Ambulatory Visit (INDEPENDENT_AMBULATORY_CARE_PROVIDER_SITE_OTHER): Payer: Medicaid Other | Admitting: Obstetrics & Gynecology

## 2020-10-16 VITALS — BP 111/75 | HR 89 | Ht 66.0 in | Wt 127.0 lb

## 2020-10-16 DIAGNOSIS — R87613 High grade squamous intraepithelial lesion on cytologic smear of cervix (HGSIL): Secondary | ICD-10-CM

## 2020-10-16 NOTE — Patient Instructions (Signed)

## 2020-10-16 NOTE — Progress Notes (Signed)
GYN presents for COLPO for HSIL/HPV PAP.   UPT today is NEGATIVE

## 2020-10-16 NOTE — Progress Notes (Signed)
Patient ID: Carla Flores, female   DOB: 09-05-2001, 19 y.o.   MRN: [id number]  Chief Complaint  Patient presents with  . Colposcopy    HPI Gift Edit [name] is a 19 y.o. female.  G1P1001  HPI  Indications: Pap smear on November 2021 showed: high-grade squamous intraepithelial neoplasia  (HGSIL-encompassing moderate and severe dysplasia). Previous colposcopy: no. Prior cervical treatment: no treatment.  Past Medical History:  Diagnosis Date  . Medical history non-contributory     Past Surgical History:  Procedure Laterality Date  . NO PAST SURGERIES      Family History  Problem Relation Age of Onset  . Diabetes Maternal Grandmother     Social History Social History   Tobacco Use  . Smoking status: Never Smoker  . Smokeless tobacco: Never Used  Vaping Use  . Vaping Use: Never used  Substance Use Topics  . Alcohol use: Never  . Drug use: Never    No Known Allergies  Current Outpatient Medications  Medication Sig Dispense Refill  . acetaminophen (TYLENOL) 325 MG tablet Take 2 tablets (650 mg total) by mouth every 6 (six) hours as needed (for pain scale < 4). 30 tablet 1  . Elastic Bandages & Supports (COMFORT FIT MATERNITY SUPP SM) MISC Wear as directed. 1 each 0  . ibuprofen (ADVIL) 600 MG tablet Take 1 tablet (600 mg total) by mouth every 6 (six) hours. 30 tablet 1  . Prenatal Vit-Fe Fumarate-FA (PREPLUS) 27-1 MG TABS Take 1 tablet by mouth daily. 30 tablet 13   No current facility-administered medications for this visit.    Review of Systems Review of Systems  Constitutional: Negative.   Genitourinary: Positive for vaginal discharge.  Psychiatric/Behavioral: The patient is nervous/anxious.     Blood pressure 111/75, pulse 89, height 5\' 6"  (1.676 m), weight 127 lb (57.6 kg), last menstrual period 09/22/2020, unknown if currently breastfeeding.  Physical Exam Physical Exam Vitals and nursing note reviewed. Exam conducted with a chaperone  present.  Genitourinary:    General: Normal vulva.     Vagina: No vaginal discharge.   Patient given informed consent, signed copy in the chart, time out was performed.  Placed in lithotomy position. Cervix viewed with speculum and colposcope after application of acetic acid.   Colposcopy adequate?  yes Acetowhite lesions? Yes 11 to 3 Punctation? no Mosaicism?  no Abnormal vasculature?  no Biopsies? x2 at 12 ECC? yes   Data Reviewed Pap HSIL  Assessment     Complications: none.     Plan    Specimens labelled and sent to Pathology. Anticipate LSIL with pap and cotesting in 12 months      09/24/2020 10/16/2020, 10:45 AM

## 2020-10-19 LAB — SURGICAL PATHOLOGY

## 2020-12-15 ENCOUNTER — Other Ambulatory Visit: Payer: Self-pay

## 2020-12-15 NOTE — Progress Notes (Signed)
Pt called requesting Rx for Depo Pt had PP in 08/2020 no birth control start was noted or discussed for depo  Pt needs appt for birth control consult.  Scheduling made aware. To call pt back and schedule with a provider.

## 2020-12-21 ENCOUNTER — Ambulatory Visit (INDEPENDENT_AMBULATORY_CARE_PROVIDER_SITE_OTHER): Payer: Medicaid Other | Admitting: Obstetrics

## 2020-12-21 ENCOUNTER — Encounter: Payer: Self-pay | Admitting: Obstetrics

## 2020-12-21 ENCOUNTER — Other Ambulatory Visit: Payer: Self-pay

## 2020-12-21 ENCOUNTER — Ambulatory Visit: Payer: Medicaid Other

## 2020-12-21 VITALS — BP 112/78 | HR 84 | Ht 66.0 in | Wt 125.0 lb

## 2020-12-21 DIAGNOSIS — Z30013 Encounter for initial prescription of injectable contraceptive: Secondary | ICD-10-CM

## 2020-12-21 DIAGNOSIS — Z3009 Encounter for other general counseling and advice on contraception: Secondary | ICD-10-CM

## 2020-12-21 DIAGNOSIS — Z0000 Encounter for general adult medical examination without abnormal findings: Secondary | ICD-10-CM | POA: Diagnosis not present

## 2020-12-21 LAB — POCT URINE PREGNANCY: Preg Test, Ur: NEGATIVE

## 2020-12-21 MED ORDER — MEDROXYPROGESTERONE ACETATE 150 MG/ML IM SUSP: 150.0000 mg | INTRAMUSCULAR | 4 refills | Status: DC

## 2020-12-21 MED ORDER — VITAFOL GUMMIES 3.33-0.333-34.8 MG PO CHEW: 3.0000 | CHEWABLE_TABLET | Freq: Every day | ORAL | 11 refills | Status: DC

## 2020-12-21 MED ORDER — MEDROXYPROGESTERONE ACETATE 150 MG/ML IM SUSP
150.0000 mg | Freq: Once | INTRAMUSCULAR | Status: AC
  Administered 2020-12-21: 150 mg via INTRAMUSCULAR

## 2020-12-21 NOTE — Progress Notes (Addendum)
GYN presents for [facility] Consult, she wants to start DEPO.  Last unprotected sex was 4 weeks ago. LMP 12/19/2020  UPT today is NEGATIVE.  Last PAP 09/17/2020  Per Provider, DEPO given in LD, tolerated well.  Next DEPO due May 9-23, 2022  Administrations This Visit    medroxyPROGESTERone (DEPO-PROVERA) injection 150 mg    Admin Date 12/21/2020 Action Given Dose 150 mg Route Intramuscular Administered By Maretta Bees, RMA

## 2020-12-21 NOTE — Progress Notes (Signed)
Subjective:    Carla Flores is a 20 y.o. female who presents for contraception counseling. The patient has no complaints today. The patient is sexually active. Pertinent past medical history: none.  The information documented in the HPI was reviewed and verified.  Menstrual History: OB History    Gravida  1   Para  1   Term  1   Preterm      AB      Living  1     SAB      IAB      Ectopic      Multiple  0   Live Births  1            Patient's last menstrual period was 12/19/2020 (exact date).   Patient Active Problem List   Diagnosis Date Noted  . Elevated BP without diagnosis of hypertension 08/12/2020  . 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 Medical history non-contributory     Past Surgical History:  Procedure Laterality Date  . NO PAST SURGERIES       Current Outpatient Medications:  .  medroxyPROGESTERone (DEPO-PROVERA) 150 MG/ML injection, Inject 1 mL (150 mg total) into the muscle every 3 (three) months., Disp: 1 mL, Rfl: 4 .  Prenatal Vit-Fe Phos-FA-Omega (VITAFOL GUMMIES) 3.33-0.333-34.8 MG CHEW, Chew 3 tablets by mouth daily before breakfast., Disp: 90 tablet, Rfl: 11 .  acetaminophen (TYLENOL) 325 MG tablet, Take 2 tablets (650 mg total) by mouth every 6 (six) hours as needed (for pain scale < 4). (Patient not taking: Reported on 12/21/2020), Disp: 30 tablet, Rfl: 1 .  Elastic Bandages & Supports (COMFORT FIT MATERNITY SUPP SM) MISC, Wear as directed. (Patient not taking: Reported on 12/21/2020), Disp: 1 each, Rfl: 0 .  ibuprofen (ADVIL) 600 MG tablet, Take 1 tablet (600 mg total) by mouth every 6 (six) hours. (Patient not taking: Reported on 12/21/2020), Disp: 30 tablet, Rfl: 1 .  Prenatal Vit-Fe Fumarate-FA (PREPLUS) 27-1 MG TABS, Take 1 tablet by mouth daily. (Patient not taking: Reported on 12/21/2020), Disp: 30 tablet, Rfl: 13  Current Facility-Administered Medications:  .   medroxyPROGESTERone (DEPO-PROVERA) injection 150 mg, 150 mg, Intramuscular, Once, Brock Bad, MD No Known Allergies  Social History   Tobacco Use  . Smoking status: Never Smoker  . Smokeless tobacco: Never Used  Substance Use Topics  . Alcohol use: Never    Family History  Problem Relation Age of Onset  . Diabetes Maternal Grandmother        Review of Systems Constitutional: negative for weight loss Genitourinary:negative for abnormal menstrual periods and vaginal discharge   Objective:   BP 112/78   Pulse 84   Ht 5\' 6"  (1.676 m)   Wt 125 lb (56.7 kg)   LMP 12/19/2020 (Exact Date)   BMI 20.18 kg/m    PE:  Deferred    Lab Review Urine pregnancy test Labs reviewed yes Radiologic studies reviewed no  50% of 15 min visit spent on counseling and coordination of care.    Assessment:    20 y.o., starting Depo-Provera injections, no contraindications.   Plan:   1. Encounter for other general counseling and advice on contraception - wants Depo Provera  2. Encounter for initial prescription of injectable contraceptive Rx: - POCT urine pregnancy - medroxyPROGESTERone (DEPO-PROVERA) injection 150 mg - medroxyPROGESTERone (DEPO-PROVERA) 150 MG/ML injection; Inject 1 mL (150 mg total) into the muscle every 3 (three) months.  Dispense: 1 mL; Refill: 4  3. Routine adult health maintenance Rx: - Prenatal Vit-Fe Phos-FA-Omega (VITAFOL GUMMIES) 3.33-0.333-34.8 MG CHEW; Chew 3 tablets by mouth daily before breakfast.  Dispense: 90 tablet; Refill: 11    All questions answered. Contraception: Depo-Provera injections. Discussed healthy lifestyle modifications. Follow up in 10 months. Pregnancy test, result: negative.    Meds ordered this encounter  Medications  . medroxyPROGESTERone (DEPO-PROVERA) injection 150 mg  . medroxyPROGESTERone (DEPO-PROVERA) 150 MG/ML injection    Sig: Inject 1 mL (150 mg total) into the muscle every 3 (three) months.    Dispense:  1  mL    Refill:  4  . Prenatal Vit-Fe Phos-FA-Omega (VITAFOL GUMMIES) 3.33-0.333-34.8 MG CHEW    Sig: Chew 3 tablets by mouth daily before breakfast.    Dispense:  90 tablet    Refill:  11   Orders Placed This Encounter  Procedures  . POCT urine pregnancy    Brock Bad, MD 12/21/2020 11:08 AM

## 2021-03-12 ENCOUNTER — Ambulatory Visit: Payer: Medicaid Other

## 2021-03-18 ENCOUNTER — Ambulatory Visit (INDEPENDENT_AMBULATORY_CARE_PROVIDER_SITE_OTHER): Payer: Medicaid Other

## 2021-03-18 ENCOUNTER — Other Ambulatory Visit: Payer: Self-pay

## 2021-03-18 DIAGNOSIS — Z3042 Encounter for surveillance of injectable contraceptive: Secondary | ICD-10-CM

## 2021-03-18 DIAGNOSIS — Z30013 Encounter for initial prescription of injectable contraceptive: Secondary | ICD-10-CM

## 2021-03-18 MED ORDER — MEDROXYPROGESTERONE ACETATE 150 MG/ML IM SUSP
150.0000 mg | Freq: Once | INTRAMUSCULAR | Status: AC
  Administered 2021-03-18: 150 mg via INTRAMUSCULAR

## 2021-03-18 MED ORDER — MEDROXYPROGESTERONE ACETATE 150 MG/ML IM SUSP: 150.0000 mg | INTRAMUSCULAR | 0 refills | Status: DC

## 2021-03-18 NOTE — Progress Notes (Signed)
Patient is here for a depo-provera injection. Given in RA IM. HCG Serum: N/A  NDC# [id number] Side Effects: None

## 2021-06-08 ENCOUNTER — Ambulatory Visit: Payer: Medicaid Other

## 2021-06-09 ENCOUNTER — Ambulatory Visit: Payer: Medicaid Other

## 2021-06-11 ENCOUNTER — Ambulatory Visit (INDEPENDENT_AMBULATORY_CARE_PROVIDER_SITE_OTHER): Payer: Medicaid Other

## 2021-06-11 ENCOUNTER — Other Ambulatory Visit: Payer: Self-pay

## 2021-06-11 VITALS — BP 115/80 | HR 80 | Ht 66.0 in | Wt 141.0 lb

## 2021-06-11 DIAGNOSIS — Z3042 Encounter for surveillance of injectable contraceptive: Secondary | ICD-10-CM

## 2021-06-11 DIAGNOSIS — Z789 Other specified health status: Secondary | ICD-10-CM | POA: Insufficient documentation

## 2021-06-11 MED ORDER — MEDROXYPROGESTERONE ACETATE 150 MG/ML IM SUSP
150.0000 mg | Freq: Once | INTRAMUSCULAR | Status: AC
  Administered 2021-06-11: 150 mg via INTRAMUSCULAR

## 2021-06-11 NOTE — Progress Notes (Signed)
Pt presents today for Depo Provera injection. Depo Provera 150mg  given IM Left deltoid. Pt tolerated well with no adverse side effects noted. Pt to return to clinic between 08/27/21-09/10/21 for repeat injection. Pt is also overdue for repeat colposcopy due to hx of abnormal pap and colposcopy's per Dr. 13/11/22. Pt to schedule both upon checkout.

## 2021-06-29 ENCOUNTER — Other Ambulatory Visit: Payer: Self-pay

## 2021-06-29 ENCOUNTER — Ambulatory Visit: Payer: Medicaid Other

## 2021-06-29 ENCOUNTER — Other Ambulatory Visit (HOSPITAL_COMMUNITY)
Admission: RE | Admit: 2021-06-29 | Discharge: 2021-06-29 | Disposition: A | Discharge status: Home / Self Care | Payer: Medicaid Other | Source: Ambulatory Visit | Attending: Obstetrics | Admitting: Obstetrics

## 2021-06-29 VITALS — BP 122/78 | HR 78

## 2021-06-29 DIAGNOSIS — N898 Other specified noninflammatory disorders of vagina: Secondary | ICD-10-CM

## 2021-06-29 NOTE — Progress Notes (Signed)
SUBJECTIVE:  20 y.o. female complains of clear vaginal discharge for a couple of days. Denies abnormal vaginal bleeding or significant pelvic pain or fever. No UTI symptoms. Denies history of known exposure to STD.  No LMP recorded. Patient has had an injection.  OBJECTIVE:  She appears well, afebrile. Urine dipstick: not done.  ASSESSMENT:  Vaginal Discharge yes Vaginal Odor yes    PLAN:  GC, chlamydia, trichomonas, BVAG, CVAG probe sent to lab. Treatment: To be determined once lab results are received ROV prn if symptoms persist or worsen.

## 2021-06-30 LAB — CERVICOVAGINAL ANCILLARY ONLY
Bacterial Vaginitis (gardnerella): NEGATIVE
Candida Glabrata: NEGATIVE
Candida Vaginitis: NEGATIVE
Chlamydia: NEGATIVE
Comment: NEGATIVE
Comment: NEGATIVE
Comment: NEGATIVE
Comment: NEGATIVE
Comment: NEGATIVE
Comment: NORMAL
Neisseria Gonorrhea: NEGATIVE
Trichomonas: NEGATIVE

## 2021-07-15 ENCOUNTER — Encounter: Payer: Medicaid Other | Admitting: Obstetrics & Gynecology

## 2021-08-31 ENCOUNTER — Other Ambulatory Visit: Payer: Self-pay

## 2021-08-31 ENCOUNTER — Ambulatory Visit: Payer: Medicaid Other

## 2021-09-01 ENCOUNTER — Ambulatory Visit (INDEPENDENT_AMBULATORY_CARE_PROVIDER_SITE_OTHER): Payer: Medicaid Other

## 2021-09-01 DIAGNOSIS — Z3042 Encounter for surveillance of injectable contraceptive: Secondary | ICD-10-CM | POA: Diagnosis not present

## 2021-09-01 MED ORDER — MEDROXYPROGESTERONE ACETATE 150 MG/ML IM SUSP
150.0000 mg | Freq: Once | INTRAMUSCULAR | Status: AC
  Administered 2021-09-01: 150 mg via INTRAMUSCULAR

## 2021-09-01 NOTE — Progress Notes (Signed)
Patient was assessed and managed by nursing staff during this encounter. I have reviewed the chart and agree with the documentation and plan.   Jaynie Collins, MD 09/01/2021 10:16 AM

## 2021-09-01 NOTE — Progress Notes (Signed)
Subjective:  Pt in for Depo Provera injection.    Objective: Need for contraception. No unusual complaints.    Assessment: Depo given L Del. Pt tolerated Depo injection.   Plan:  Next injection due 1/18 - 12/01/2021.

## 2021-10-05 ENCOUNTER — Encounter: Payer: Self-pay | Admitting: Obstetrics and Gynecology

## 2021-10-05 ENCOUNTER — Other Ambulatory Visit: Payer: Self-pay

## 2021-10-05 ENCOUNTER — Ambulatory Visit (INDEPENDENT_AMBULATORY_CARE_PROVIDER_SITE_OTHER): Payer: Medicaid Other | Admitting: Obstetrics and Gynecology

## 2021-10-05 ENCOUNTER — Other Ambulatory Visit (HOSPITAL_COMMUNITY)
Admission: RE | Admit: 2021-10-05 | Discharge: 2021-10-05 | Disposition: A | Discharge status: Home / Self Care | Payer: Medicaid Other | Source: Ambulatory Visit | Attending: Obstetrics & Gynecology | Admitting: Obstetrics & Gynecology

## 2021-10-05 DIAGNOSIS — Z87410 Personal history of cervical dysplasia: Secondary | ICD-10-CM | POA: Insufficient documentation

## 2021-10-05 DIAGNOSIS — Z113 Encounter for screening for infections with a predominantly sexual mode of transmission: Secondary | ICD-10-CM | POA: Diagnosis not present

## 2021-10-05 DIAGNOSIS — R102 Pelvic and perineal pain: Secondary | ICD-10-CM | POA: Diagnosis not present

## 2021-10-05 DIAGNOSIS — N941 Unspecified dyspareunia: Secondary | ICD-10-CM | POA: Diagnosis not present

## 2021-10-05 NOTE — Progress Notes (Signed)
  CC: need for pap smear Subjective:    Patient ID: Carla Flores, female    DOB: 16-Feb-2001, 20 y.o.   MRN: [id number]  HPI 20 yo G1P1 seen at [facility] clinic with history of abnormal pap.  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  There was no further treatment or surveillance after the colpo.  Pt was given option of repeat pap smear today or proceeding with colposcopy.  Pt desired pap smear as well as STD check.  Pt also report recent history of vaginal pain since her delivery.  She does remember having a second degree tear.  She reports the pain is usually midline in the vagina and primarily the right sidewall.  She has tried extra lube, but still notes intermittent vaginal pain.  Pt states the pain can wax and wane during intercourse and usually is worse during initial penetration.   Review of Systems     Objective:   Physical Exam Vitals:   10/05/21 1312  BP: 114/76  Pulse: 87  SVE: vagina intact with no excessive scarring or granulation tissue.  Mild tenderness at midline and right sidewall with digital pressure as well as q tip.  Questionable increased tension of vaginal wall muscles.  No obvious defects noted and vagina well healed       Assessment & Plan:   1. History of cervical dysplasia Pap taken without incident, will proceed pending results - Cytology - PAP( [facility])  2. Screening examination for STD (sexually transmitted disease) Per pt request  3. Vaginal pain Questionable vaginismus or nerve related pain due to birth trauma.  Discussed three options  Expectant management with liberal use of lubrication Pelvic PT for treatment of pain Referral to urogynecology for second opinion.  Pt desires expectant management x 6 months, but will call if symptoms worsen  4. Dyspareunia, female Same as above  Follow up in 6 months with virtual visit to discuss dyspareunia   Warden Fillers,  MD Faculty Attending, Center for [facility]

## 2021-10-05 NOTE — Progress Notes (Signed)
Patient presents for a colpo, but with with lapse in care. She was given the option to repeat pap or colpo. Patient decided to repeat pap.

## 2021-10-06 LAB — CERVICOVAGINAL ANCILLARY ONLY
Bacterial Vaginitis (gardnerella): NEGATIVE
Candida Glabrata: NEGATIVE
Candida Vaginitis: NEGATIVE
Chlamydia: NEGATIVE
Comment: NEGATIVE
Comment: NEGATIVE
Comment: NEGATIVE
Comment: NEGATIVE
Comment: NORMAL
Neisseria Gonorrhea: NEGATIVE

## 2021-10-08 LAB — CYTOLOGY - PAP: Diagnosis: HIGH — AB

## 2021-10-11 ENCOUNTER — Encounter: Payer: Self-pay | Admitting: Obstetrics and Gynecology

## 2021-11-09 ENCOUNTER — Other Ambulatory Visit (HOSPITAL_COMMUNITY)
Admission: RE | Admit: 2021-11-09 | Discharge: 2021-11-09 | Disposition: A | Discharge status: Home / Self Care | Payer: Medicaid Other | Source: Ambulatory Visit | Attending: Obstetrics and Gynecology | Admitting: Obstetrics and Gynecology

## 2021-11-09 ENCOUNTER — Encounter: Payer: Self-pay | Admitting: Obstetrics and Gynecology

## 2021-11-09 ENCOUNTER — Ambulatory Visit (INDEPENDENT_AMBULATORY_CARE_PROVIDER_SITE_OTHER): Payer: Medicaid Other | Admitting: Obstetrics and Gynecology

## 2021-11-09 ENCOUNTER — Other Ambulatory Visit: Payer: Self-pay

## 2021-11-09 DIAGNOSIS — R87613 High grade squamous intraepithelial lesion on cytologic smear of cervix (HGSIL): Secondary | ICD-10-CM | POA: Diagnosis not present

## 2021-11-09 DIAGNOSIS — Z01812 Encounter for preprocedural laboratory examination: Secondary | ICD-10-CM

## 2021-11-09 NOTE — Progress Notes (Signed)
Patient given informed consent, signed copy in the chart, time out was performed.  Chart reviewed with pap showing CIN2  and biopsy showing CIN 2 one year ago.  Placed in lithotomy position. Cervix viewed with speculum and colposcope after application of acetic acid.   Colposcopy adequate?  yes Acetowhite lesions? Yes, visually CIN 1 Punctation? Yes, mild Mosaicism?  no Abnormal vasculature?  no Biopsies?yes at 12 and 1 oclock ECC?yes  COMMENTS: Patient was given post procedure instructions.  Plan pending biopsy results.  Warden Fillers, MD

## 2021-11-09 NOTE — Progress Notes (Signed)
Pt is here today for colpo. LMP: Last pap: 10/05/21 HGSIL.

## 2021-11-11 LAB — SURGICAL PATHOLOGY

## 2021-11-17 ENCOUNTER — Ambulatory Visit (INDEPENDENT_AMBULATORY_CARE_PROVIDER_SITE_OTHER): Payer: Medicaid Other | Admitting: *Deleted

## 2021-11-17 ENCOUNTER — Other Ambulatory Visit: Payer: Self-pay | Admitting: *Deleted

## 2021-11-17 ENCOUNTER — Encounter: Payer: Self-pay | Admitting: *Deleted

## 2021-11-17 ENCOUNTER — Other Ambulatory Visit: Payer: Self-pay

## 2021-11-17 DIAGNOSIS — Z3042 Encounter for surveillance of injectable contraceptive: Secondary | ICD-10-CM | POA: Diagnosis not present

## 2021-11-17 DIAGNOSIS — Z30013 Encounter for initial prescription of injectable contraceptive: Secondary | ICD-10-CM

## 2021-11-17 MED ORDER — MEDROXYPROGESTERONE ACETATE 150 MG/ML IM SUSP: 150.0000 mg | INTRAMUSCULAR | 4 refills | Status: DC

## 2021-11-17 MED ORDER — MEDROXYPROGESTERONE ACETATE 150 MG/ML IM SUSP
150.0000 mg | Freq: Once | INTRAMUSCULAR | Status: AC
  Administered 2021-11-17: 150 mg via INTRAMUSCULAR

## 2021-11-17 NOTE — Progress Notes (Signed)
Date last pap: 10/05/21. Last Depo-Provera: 09/01/21. Side Effects if any: NA. Serum HCG indicated? NA. Depo-Provera 150 mg IM given by: Lillia Abed. Jordin Vicencio, RNC in Left deltoid (per pt request). Next appointment due 02/02/22-02/16/22.   Depo from office staff. Previous provider did not sent RX for the year. RX sent today but not ready for pick up when patient tried to pick up.

## 2021-11-24 ENCOUNTER — Telehealth (INDEPENDENT_AMBULATORY_CARE_PROVIDER_SITE_OTHER): Payer: Medicaid Other | Admitting: Obstetrics and Gynecology

## 2021-11-24 DIAGNOSIS — R87613 High grade squamous intraepithelial lesion on cytologic smear of cervix (HGSIL): Secondary | ICD-10-CM

## 2021-11-24 NOTE — Progress Notes (Signed)
° ° °  GYNECOLOGY VIRTUAL VISIT ENCOUNTER NOTE  Provider location: Center for [facility]   Patient location: Home  I connected with Carla Flores on 11/24/21 at 10:55 AM EST by MyChart Video Encounter and verified that I am speaking with the correct person using two identifiers.   I discussed the limitations, risks, security and privacy concerns of performing an evaluation and management service virtually and the availability of in person appointments. I also discussed with the patient that there may be a patient responsible charge related to this service. The patient expressed understanding and agreed to proceed.   History:  Carla Flores is a 21 y.o. G29P1001 female being evaluated today for colposcopy result and CIN3. She denies any abnormal vaginal discharge, bleeding, pelvic pain or other concerns.  Long discussion with patient after review of guidelines, repeat colpo in 4-6 months versus LEEP for treatment.  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     Past Medical History:  Diagnosis Date   Vaginal Pap smear, abnormal    Past Surgical History:  Procedure Laterality Date   NO PAST SURGERIES     The following portions of the patient's history were reviewed and updated as appropriate: allergies, current medications, past family history, past medical history, past social history, past surgical history and problem list.   Health Maintenance:pap and colpo CIN 3  Review of Systems:  Pertinent items noted in HPI and remainder of comprehensive ROS otherwise negative.  Physical Exam:   General:  Alert, oriented and cooperative. Patient appears to be in no acute distress.  Mental Status: Normal mood and affect. Normal behavior. Normal judgment and thought content.   Respiratory: Normal respiratory effort, no problems with respiration noted  Rest of physical exam deferred due to type of  encounter  Labs and Imaging No results found for this or any previous visit (from the past 336 hour(s)). No results found.     Assessment and Plan:     1. High grade squamous intraepithelial cervical dysplasia After much discussion, including pregnancy considerations with LEEP, pt desires to have the LEEP procedure.  She desires to have the procedure in late February or early March.       I discussed the assessment and treatment plan with the patient. The patient was provided an opportunity to ask questions and all were answered. The patient agreed with the plan and demonstrated an understanding of the instructions.   s to improve as anticipated.  I provided 10 minutes of face-to-face time during this encounter.   Warden Fillers, MD Center for [employer], [facility]

## 2021-12-30 ENCOUNTER — Other Ambulatory Visit: Payer: Self-pay

## 2021-12-30 ENCOUNTER — Ambulatory Visit (INDEPENDENT_AMBULATORY_CARE_PROVIDER_SITE_OTHER): Payer: Medicaid Other | Admitting: Obstetrics and Gynecology

## 2021-12-30 ENCOUNTER — Encounter: Payer: Self-pay | Admitting: Obstetrics and Gynecology

## 2021-12-30 ENCOUNTER — Other Ambulatory Visit (HOSPITAL_COMMUNITY)
Admission: RE | Admit: 2021-12-30 | Discharge: 2021-12-30 | Disposition: A | Discharge status: Home / Self Care | Payer: Medicaid Other | Source: Ambulatory Visit | Attending: Obstetrics and Gynecology | Admitting: Obstetrics and Gynecology

## 2021-12-30 VITALS — BP 126/90 | HR 85 | Ht 66.0 in | Wt 147.6 lb

## 2021-12-30 DIAGNOSIS — R87613 High grade squamous intraepithelial lesion on cytologic smear of cervix (HGSIL): Secondary | ICD-10-CM

## 2021-12-30 DIAGNOSIS — Z01812 Encounter for preprocedural laboratory examination: Secondary | ICD-10-CM | POA: Diagnosis not present

## 2021-12-30 DIAGNOSIS — Z3202 Encounter for pregnancy test, result negative: Secondary | ICD-10-CM

## 2021-12-30 LAB — POCT URINE PREGNANCY: Preg Test, Ur: NEGATIVE

## 2021-12-30 NOTE — Progress Notes (Signed)
LEEP procedure note: ? ? ? ?Patient identified, informed consent obtained, signed copy in chart, time out performed.  Pap smear and colposcopy reviewed.  Pregnancy test confirmed negative. ? ?Pap HGSL ?Colpo Biopsy CIN 3 ?ECC: n/a ?Teflon coated speculum with smoke evacuator placed.  Cervix visualized. ?Paracervical block placed.  Small size LOOP used to remove small cone of anterior cervix using blend of cut and cautery on LEEP machine.  Edges/Base cauterized with Ball.  Monsel's solution used for hemostasis.  Patient tolerated procedure well. ? ?Patient given post procedure instructions.  Follow up in 1 month to eval LEEP site. ? ?Mariel Aloe, MD ?

## 2021-12-30 NOTE — Progress Notes (Signed)
Patient presents for LEEP procedure.  ?HSIL 10/05/21 ?UPT- ? ?

## 2022-01-03 LAB — SURGICAL PATHOLOGY

## 2022-02-08 ENCOUNTER — Ambulatory Visit (INDEPENDENT_AMBULATORY_CARE_PROVIDER_SITE_OTHER): Payer: Medicaid Other | Admitting: Obstetrics and Gynecology

## 2022-02-08 ENCOUNTER — Encounter: Payer: Self-pay | Admitting: Obstetrics and Gynecology

## 2022-02-08 VITALS — BP 119/85 | HR 89 | Ht 66.0 in | Wt 146.6 lb

## 2022-02-08 DIAGNOSIS — R87613 High grade squamous intraepithelial lesion on cytologic smear of cervix (HGSIL): Secondary | ICD-10-CM | POA: Diagnosis not present

## 2022-02-08 DIAGNOSIS — Z9889 Other specified postprocedural states: Secondary | ICD-10-CM | POA: Diagnosis not present

## 2022-02-08 NOTE — Progress Notes (Signed)
?  CC: LEEP follow up ?Subjective:  ? ? Patient ID: Carla Flores, female    DOB: 20-Apr-2001, 21 y.o.   MRN: [id number] ? ?HPI ?Pt seen for LEEP follow up.  Pt has no complaints.  Reviewed pathology with the patient showing CIN 3 with involvement of ectocervical margins ? ? ?Review of Systems ? ?   ?Objective:  ? Physical Exam ?Vitals:  ? 02/08/22 1109  ?BP: 119/85  ?Pulse: 89  ? ?SSE: well healed cervix, no obvious defects. ? ? ?   ?Assessment & Plan:  ? ?1. High grade squamous intraepithelial cervical dysplasia ?Advised to patient we will obtain repap in 6 months to evaluate treatment ? ?2. S/P LEEP ? ? ?F/u in 6 months. ? ?Warden Fillers, MD ?Faculty Attending, Center for [facility]  ?

## 2022-02-08 NOTE — Progress Notes (Signed)
Pt in office for LEEP follow up. Pt concerns at this time.  ?

## 2022-02-09 ENCOUNTER — Ambulatory Visit (INDEPENDENT_AMBULATORY_CARE_PROVIDER_SITE_OTHER): Payer: Medicaid Other | Admitting: Certified Nurse Midwife

## 2022-02-09 VITALS — BP 121/86 | HR 86 | Wt 145.0 lb

## 2022-02-09 DIAGNOSIS — N942 Vaginismus: Secondary | ICD-10-CM | POA: Diagnosis not present

## 2022-02-09 DIAGNOSIS — Z3042 Encounter for surveillance of injectable contraceptive: Secondary | ICD-10-CM | POA: Diagnosis not present

## 2022-02-09 MED ORDER — MEDROXYPROGESTERONE ACETATE 150 MG/ML IM SUSP
150.0000 mg | Freq: Once | INTRAMUSCULAR | Status: AC
  Administered 2022-02-09: 150 mg via INTRAMUSCULAR

## 2022-02-09 NOTE — Patient Instructions (Signed)
Daily Probiotic ?Evening Primrose Oil By mouth ?Ashwaghanda ? ? ? ?

## 2022-02-09 NOTE — Progress Notes (Signed)
Date last pap: 10/05/21. ?Last Depo-Provera: 11/17/2021. ?Side Effects if any: NA. ?Serum HCG indicated? NA. ?Depo-Provera 150 mg IM given by: Corinna Lines, RN into RIGHT deltoid. Tolerated well, no adverse effects noted. ?Next appointment due : Jun 28-Jul 12.  ?

## 2022-02-21 IMAGING — US US MFM OB COMP +14 WKS
1 series · 13 of 28 positions shown · non-contrast
Comparison: none

[Series 1: us mfm ob comp +14 wks · 13 of 87 slices shown]
[im 4/87]
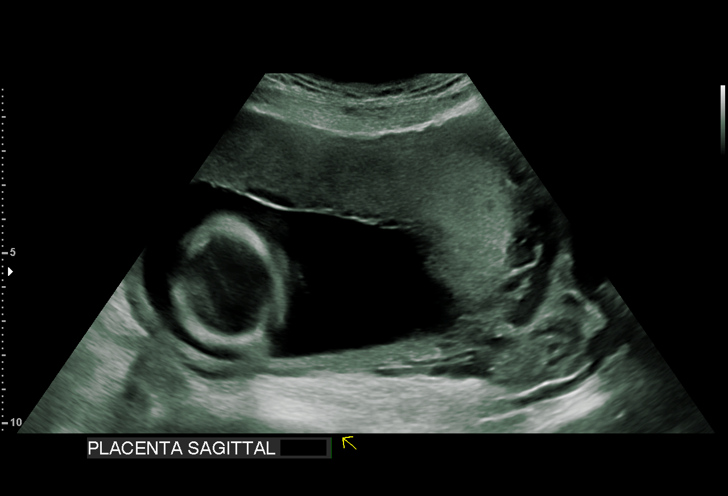
[im 10/87]
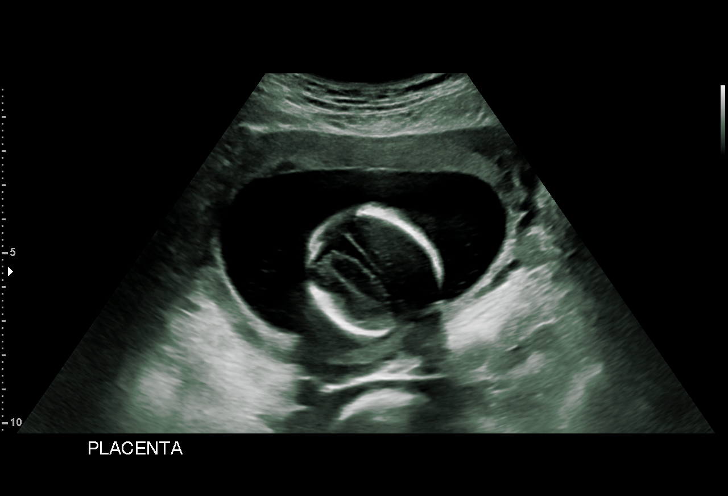
[im 16/87]
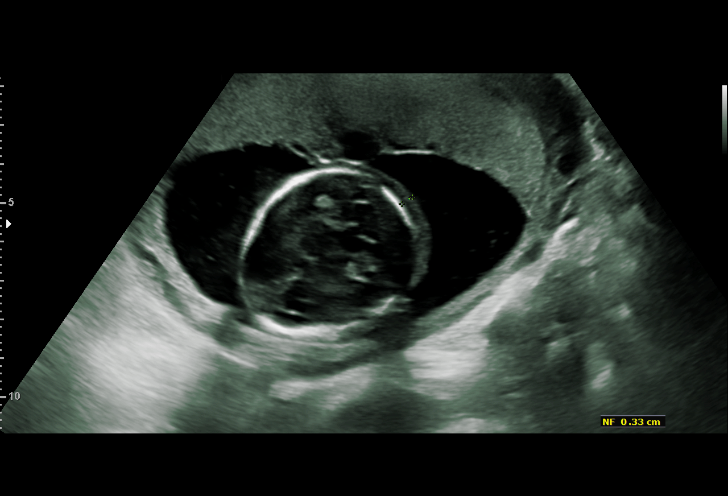
[im 23/87]
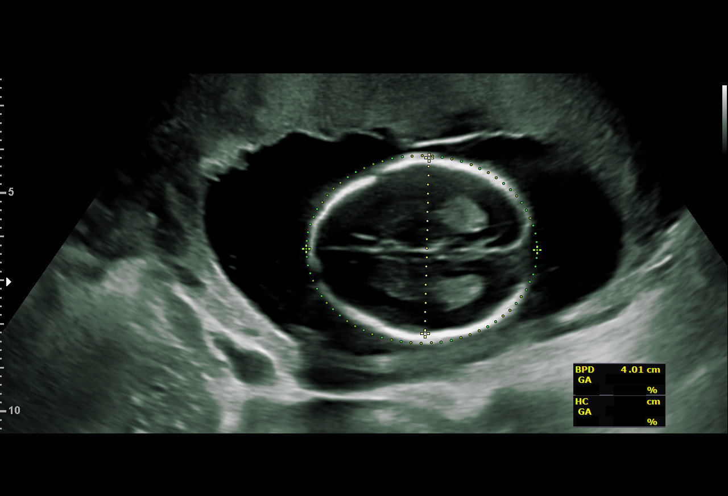
[im 29/87]
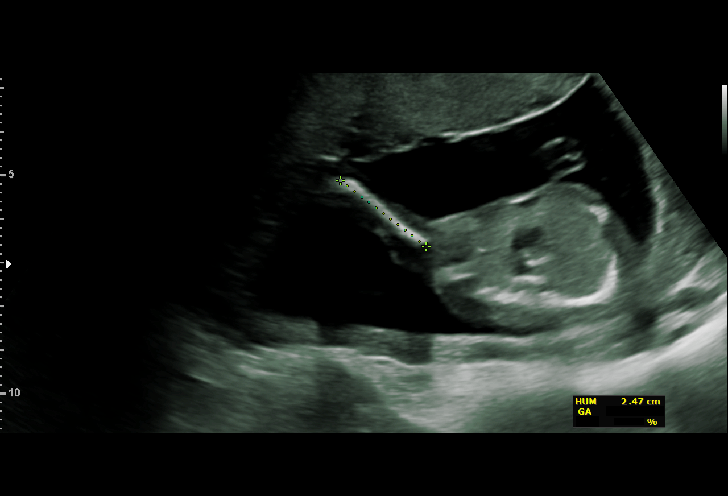
[im 36/87]
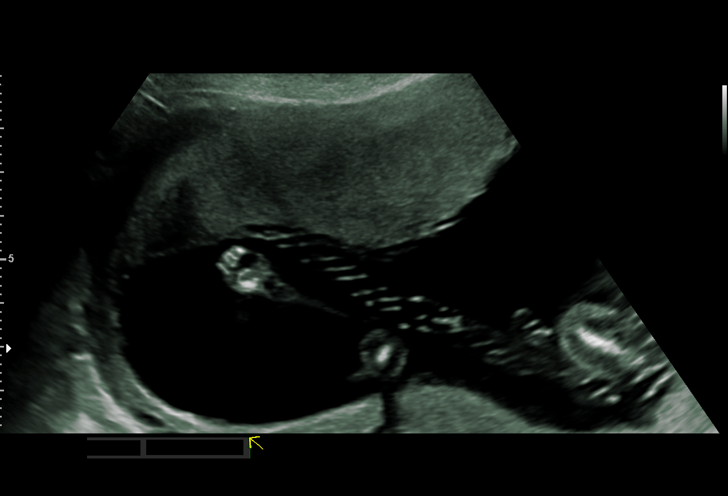
[im 45/87]
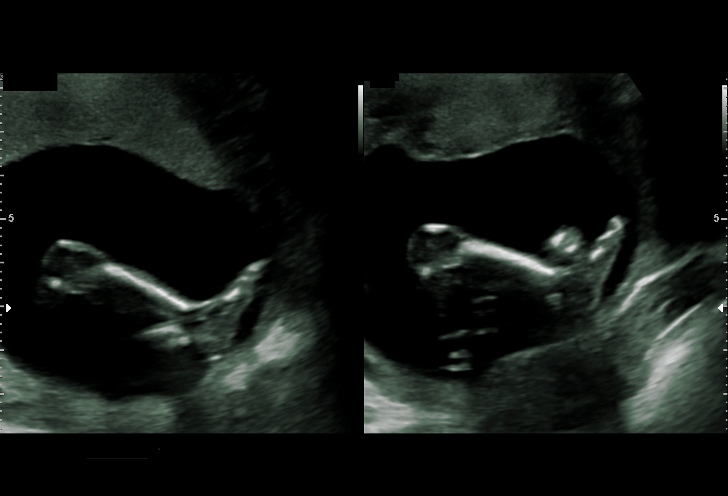
[im 51/87]
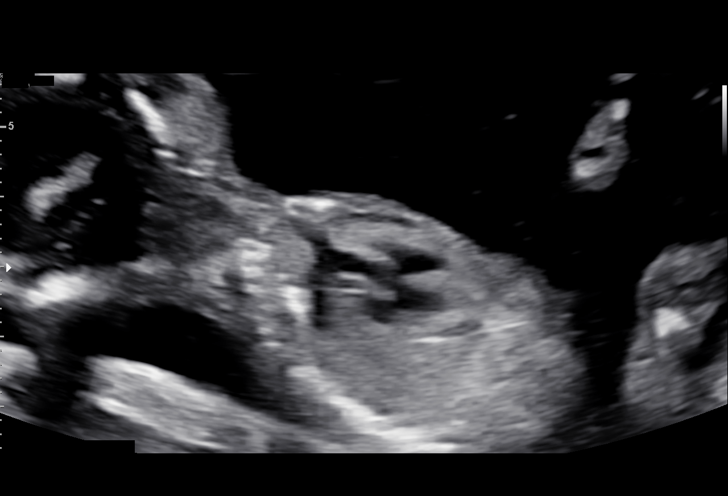
[im 58/87]
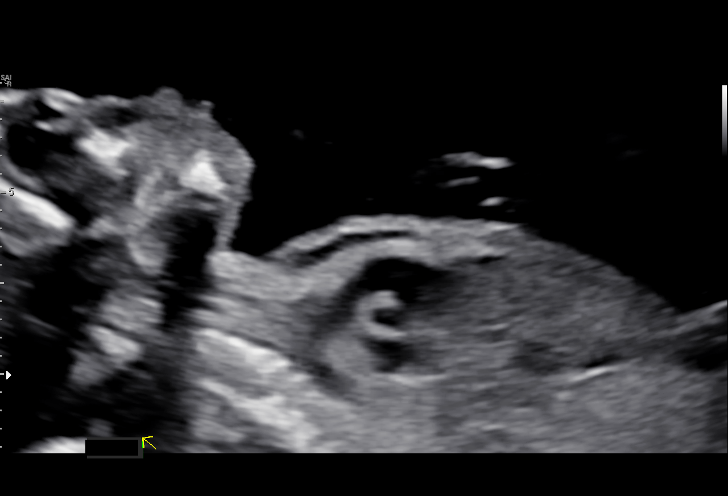
[im 64/87]
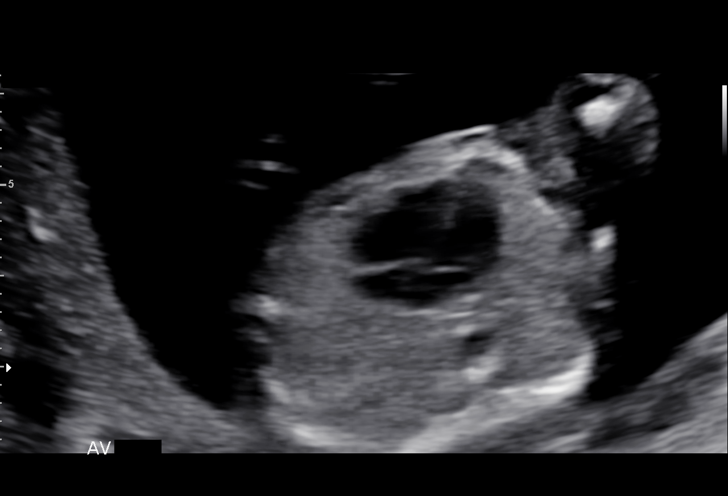
[im 71/87]
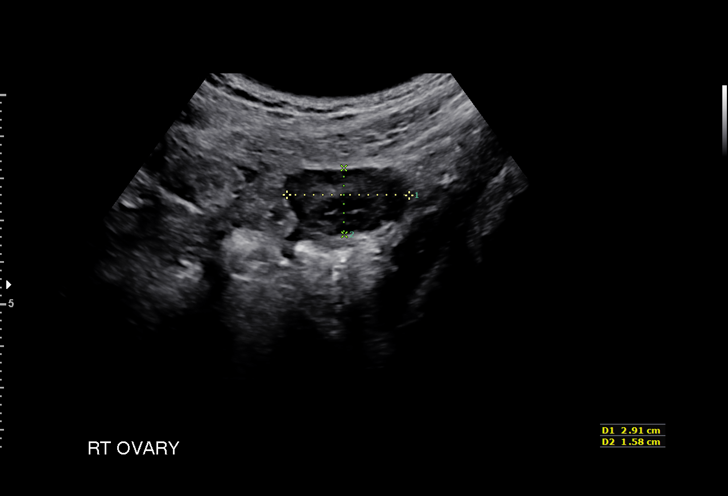
[im 77/87]
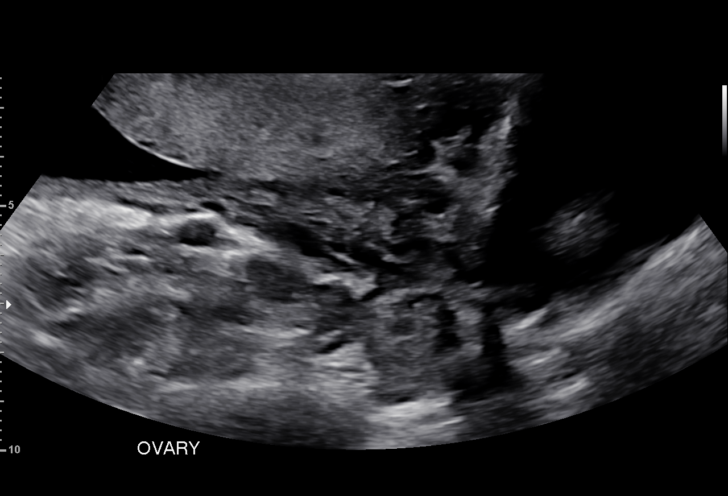
[im 83/87]
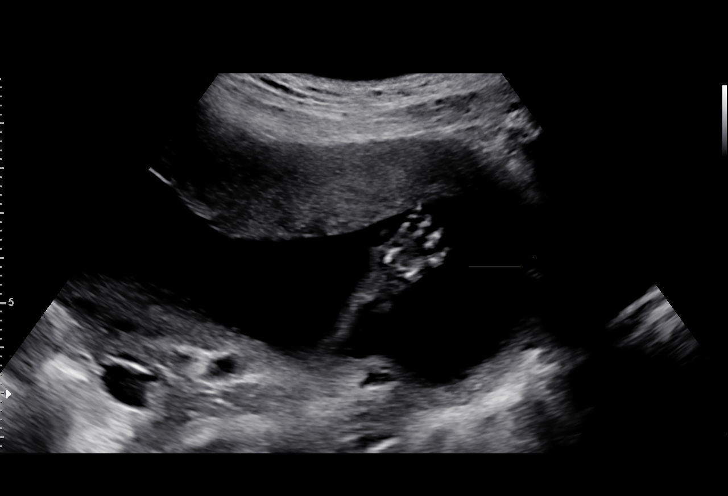

[13 of 28 positions shown; findings below may reference images not displayed]

1   US MFM OB COMP + 14 WK               76805.01     NAZARETH JUMPER

Indications

 Encounter for antenatal screening for
 malformations
 18 weeks gestation of pregnancy
 NEG NIPS, NEG AFP,  HORIZON Intermediate
 Allele Fragile X  (declined genetic counseling)
Fetal Evaluation

 Num Of Fetuses:          1
 Fetal Heart Rate(bpm):   142
 Cardiac Activity:        Observed
 Presentation:            Breech
 Placenta:                Anterior
 P. Cord Insertion:       Visualized, central

 Amniotic Fluid
 AFI FV:      Within normal limits

                             Largest Pocket(cm)

Biometry

 BPD:      40.6   mm     G. Age:  18w 2d         27  %    CI:          75.2  %    70 - 86
                                                          FL/HC:       18.1  %    16.1 -
 HC:      148.5   mm     G. Age:  18w 0d          8  %    HC/AC:       1.14       1.09 -
 AC:      130.4   mm     G. Age:  18w 4d         36  %    FL/BPD:      66.3  %
 FL:       26.9   mm     G. Age:  18w 1d         21  %    FL/AC:       20.6  %    20 - 24
 HUM:      25.9   mm     G. Age:  18w 1d         31  %
 CER:      18.1   mm     G. Age:  18w 0d         25  %
 NFT:        3.3  mm

 LV:         5.8  mm
 CM:         4.2  mm

 Est. FW:     236   gm      0 lb 8 oz     19  %
OB History

 Gravidity:     1
Gestational Age

 LMP:            18w 6d       Date:  11/07/19                   EDD:  08/13/20
 U/S Today:      18w 2d                                         EDD:  08/17/20
 Best:           18w 6d    Det. By:  LMP  (11/07/19)            EDD:  08/13/20
Anatomy

 Cranium:                Appears normal         LVOT:                   Appears normal
 Cavum:                  Appears normal         Aortic Arch:            Not well visualized
 Ventricles:             Appears normal         Ductal Arch:            Appears normal
 Choroid Plexus:         Appears normal         Diaphragm:              Appears normal
 Cerebellum:             Appears normal         Stomach:                Appears normal, left
                                                                        sided
 Posterior Fossa:        Appears normal         Abdomen:                Appears normal
 Nuchal Fold:            Appears normal         Abdominal Wall:         Appears nml (cord
                                                                        insert, abd wall)
 Face:                   Appears normal         Cord Vessels:           Appears normal (3
                         (orbits and profile)                           vessel cord)
 Lips:                   Not well visualized    Kidneys:                Appear normal
 Palate:                 Appears normal         Bladder:                Appears normal
 Thoracic:               Appears normal         Spine:                  Not well visualized
 Heart:                  Appears normal         Upper Extremities:      Appears normal
                         (4CH, axis, and situs)
 RVOT:                   Appears normal         Lower Extremities:      Appears normal

 Other:   Female gender Technically difficult due to fetal position.
Cervix Uterus Adnexa

 Cervix
 Length:            3.12  cm.
 Normal appearance by transabdominal scan.

 Right Ovary
 Within normal limits.

 Left Ovary
 Within normal limits.
Impression

 Normal anatomy with measurements consistent with dates.
 Suboptimal views of the fetal anatomy was obtained secondary
 to fetal position.
 Good fetal movement and amniotic fluid observed.
Recommendations

 Follow up as clinically indicated.

## 2022-03-05 NOTE — Therapy (Deleted)
OUTPATIENT PHYSICAL THERAPY FEMALE PELVIC EVALUATION   Patient Name: Carla Flores MRN: [id number] DOB:2001-08-15, 21 y.o., female Today's Date: 03/05/2022    Past Medical History:  Diagnosis Date   Vaginal Pap smear, abnormal    Past Surgical History:  Procedure Laterality Date   NO PAST SURGERIES     Patient Active Problem List   Diagnosis Date Noted   S/P LEEP 02/08/2022   High grade squamous intraepithelial cervical dysplasia 11/09/2021   History of cervical dysplasia 10/05/2021   Screening examination for STD (sexually transmitted disease) 10/05/2021   Vaginal pain 10/05/2021   Dyspareunia, female 10/05/2021   Medical history non-contributory 06/11/2021   Elevated BP without diagnosis of hypertension 08/12/2020   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  ONSET DATE: ***  SUBJECTIVE:                                                                                                                                                                                           SUBJECTIVE STATEMENT: ***issues with sex - has been painful since delivery and has not gotten better despite appropriate arousal and lubrication. Fluid intake: {Yes/No:304960894}   Patient confirms identification and approves PT to assess pelvic floor and treatment {yes/no:20286}   PAIN:  Are you having pain? {yes/no:20286} NPRS scale: ***/10 Pain location: {pelvic pain location:27098}  Pain type: {type:313116} Pain description: {PAIN DESCRIPTION:21022940}   Aggravating factors: *** Relieving factors: ***  PRECAUTIONS: {Therapy precautions:24002}  WEIGHT BEARING RESTRICTIONS {Yes ***/No:24003}  FALLS:  Has patient fallen in last 6 months? {fallsyesno:27318}  LIVING ENVIRONMENT: Lives with: {OPRC lives with:25569::"lives  with their family"} Lives in: {Lives in:25570} Stairs: {opstairs:27293} Has following equipment at home: {Assistive devices:23999}  OCCUPATION: ***  PLOF: {PLOF:24004}  PATIENT GOALS ***  PERTINENT HISTORY:  *** Sexual abuse: {Yes/No:304960894}  BOWEL MOVEMENT Pain with bowel movement: {yes/no:20286} Type of bowel movement:{PT BM type:27100} Fully empty rectum: {Yes/No:304960894} Leakage: {Yes/No:304960894} Pads: {Yes/No:304960894} Fiber supplement: {Yes/No:304960894}  URINATION Pain with urination: {yes/no:20286} Fully empty bladder: {Yes/No:304960894} Stream: {PT urination:27102} Urgency: {Yes/No:304960894} Frequency: *** Leakage: {PT leakage:27103} Pads: {Yes/No:304960894}  INTERCOURSE Pain with intercourse: {pain with intercourse PA:27099} Ability to have vaginal penetration:  {Yes/No:304960894} Climax: *** Marinoff Scale: ***/3  PREGNANCY Vaginal deliveries *** Tearing {Yes***/No:304960894} C-section deliveries *** Currently pregnant {Yes***/No:304960894}  PROLAPSE {PT prolapse:27101}    OBJECTIVE:   DIAGNOSTIC FINDINGS:  ***  PATIENT SURVEYS:  {rehab surveys:24030}  PFIQ-7 ***  COGNITION:  Overall cognitive status: {cognition:24006}  SENSATION:  Light touch: {intact/deficits:24005}  Proprioception: {intact/deficits:24005}  MUSCLE LENGTH: Hamstrings: Right *** deg; Left *** deg Thomas test: Right *** deg; Left *** deg  LUMBAR SPECIAL TESTS:  {lumbar special test:25242}  FUNCTIONAL TESTS:  {Functional tests:24029}  GAIT: Distance walked: *** Assistive device utilized: {Assistive devices:23999} Level of assistance: {Levels of assistance:24026} Comments: ***  POSTURE:  ***  LUMBARAROM/PROM  A/PROM A/PROM  03/05/2022  Flexion   Extension   Right lateral flexion   Left lateral flexion   Right rotation   Left rotation    (Blank rows = not tested)  LE ROM:  {AROM/PROM:27142} ROM Right 03/05/2022 Left 03/05/2022  Hip  flexion    Hip extension    Hip abduction    Hip adduction    Hip internal rotation    Hip external rotation    Knee flexion    Knee extension    Ankle dorsiflexion    Ankle plantarflexion    Ankle inversion    Ankle eversion     (Blank rows = not tested)  LE MMT:  MMT Right 03/05/2022 Left 03/05/2022  Hip flexion    Hip extension    Hip abduction    Hip adduction    Hip internal rotation    Hip external rotation    Knee flexion    Knee extension    Ankle dorsiflexion    Ankle plantarflexion    Ankle inversion    Ankle eversion     PELVIC MMT:   MMT  03/05/2022  Vaginal   Internal Anal Sphincter   External Anal Sphincter   Puborectalis   Diastasis Recti   (Blank rows = not tested)        PALPATION:   General  ***                External Perineal Exam ***                             Internal Pelvic Floor ***  TONE: ***  PROLAPSE: ***  TODAY'S TREATMENT  EVAL ***   PATIENT EDUCATION:  Education details: *** Person educated: {Person educated:25204} Education method: {Education Method:25205} Education comprehension: {Education Comprehension:25206}   HOME EXERCISE PROGRAM: ***  ASSESSMENT:  CLINICAL IMPRESSION: Patient is a *** y.o. *** who was seen today for physical therapy evaluation and treatment for ***.    OBJECTIVE IMPAIRMENTS {opptimpairments:25111}.   ACTIVITY LIMITATIONS {activity limitations:25113}.   PERSONAL FACTORS {Personal factors:25162} are also affecting patient's functional outcome.    REHAB POTENTIAL: {rehabpotential:25112}  CLINICAL DECISION MAKING: {clinical decision making:25114}  EVALUATION COMPLEXITY: {Evaluation complexity:25115}   GOALS: Goals reviewed with patient? {yes/no:20286}  SHORT TERM GOALS: Target date: {follow up:25551}  *** Baseline: Goal status: {GOALSTATUS:25110}  2.  *** Baseline:  Goal status: {GOALSTATUS:25110}  3.  *** Baseline:  Goal status: {GOALSTATUS:25110}  4.   *** Baseline:  Goal status: {GOALSTATUS:25110}  5.  *** Baseline:  Goal status: {GOALSTATUS:25110}  6.  *** Baseline:  Goal status: {GOALSTATUS:25110}  LONG TERM GOALS: Target date: {follow up:25551}  *** Baseline:  Goal status: {GOALSTATUS:25110}  2.  *** Baseline:  Goal status: {GOALSTATUS:25110}  3.  *** Baseline:  Goal status: {GOALSTATUS:25110}  4.  *** Baseline:  Goal status: {GOALSTATUS:25110}  5.  *** Baseline:  Goal status: {GOALSTATUS:25110}  6.  *** Baseline:  Goal status: {GOALSTATUS:25110}  PLAN: PT FREQUENCY: {rehab frequency:25116}  PT DURATION: {rehab duration:25117}  PLANNED INTERVENTIONS: {rehab planned interventions:25118::"Therapeutic exercises","Therapeutic activity","Neuromuscular  re-education","Balance training","Gait training","Patient/Family education","Joint mobilization"}  PLAN FOR NEXT SESSION: ***   Brayton Caves Irving Bloor, PT 03/05/2022, 6:59 PM

## 2022-03-07 ENCOUNTER — Ambulatory Visit: Payer: Medicaid Other | Attending: Certified Nurse Midwife | Admitting: Physical Therapy

## 2022-03-07 ENCOUNTER — Ambulatory Visit (INDEPENDENT_AMBULATORY_CARE_PROVIDER_SITE_OTHER): Payer: Medicaid Other

## 2022-03-07 DIAGNOSIS — R300 Dysuria: Secondary | ICD-10-CM

## 2022-03-07 LAB — POCT URINALYSIS DIPSTICK
Bilirubin, UA: NEGATIVE
Glucose, UA: NEGATIVE
Ketones, UA: POSITIVE
Nitrite, UA: NEGATIVE
Protein, UA: NEGATIVE
Spec Grav, UA: >=1.03 — AB (ref 1.010–1.025)
Urobilinogen, UA: 0.2 E.U./dL
pH, UA: 5 (ref 5.0–8.0)

## 2022-03-07 NOTE — Progress Notes (Signed)
SUBJECTIVE: Carla Flores is a 21 y.o. female who complains of urinary frequency, urgency and dysuria x 10 days, without flank pain, fever, chills, or abnormal vaginal discharge or bleeding.  ? ?OBJECTIVE: Appears well, in no apparent distress.  Vital signs are normal. Urine dipstick shows positive for leukocytes and positive for ketones.   ? ?ASSESSMENT: Dysuria ? ?PLAN: Per Dr. Clearance Coots; Treatment per orders based on results of Urine Culture.  Call or return to clinic prn if these symptoms worsen or fail to improve as anticipated.  ?

## 2022-03-10 LAB — URINE CULTURE

## 2022-03-14 ENCOUNTER — Other Ambulatory Visit: Payer: Self-pay | Admitting: *Deleted

## 2022-03-14 DIAGNOSIS — N390 Urinary tract infection, site not specified: Secondary | ICD-10-CM

## 2022-03-14 MED ORDER — NITROFURANTOIN MONOHYD MACRO 100 MG PO CAPS: 100.0000 mg | ORAL_CAPSULE | Freq: Two times a day (BID) | ORAL | 1 refills | Status: DC

## 2022-03-14 NOTE — Progress Notes (Signed)
TC from pt requesting RX for positive urine culture from 03/07/22. Dr. Jolayne Panther consulted. RX Macrobid 100 mg po bid. Pt notified. ?

## 2022-03-22 ENCOUNTER — Encounter: Payer: Self-pay | Admitting: Emergency Medicine

## 2022-03-22 ENCOUNTER — Other Ambulatory Visit: Payer: Self-pay | Admitting: Obstetrics

## 2022-04-25 ENCOUNTER — Other Ambulatory Visit: Payer: Self-pay

## 2022-04-25 ENCOUNTER — Ambulatory Visit: Payer: Medicaid Other | Attending: Certified Nurse Midwife | Admitting: Physical Therapy

## 2022-04-25 ENCOUNTER — Encounter: Payer: Self-pay | Admitting: Physical Therapy

## 2022-04-25 DIAGNOSIS — M6281 Muscle weakness (generalized): Secondary | ICD-10-CM | POA: Insufficient documentation

## 2022-04-25 DIAGNOSIS — R293 Abnormal posture: Secondary | ICD-10-CM | POA: Diagnosis present

## 2022-04-25 DIAGNOSIS — N942 Vaginismus: Secondary | ICD-10-CM | POA: Insufficient documentation

## 2022-04-25 DIAGNOSIS — M62838 Other muscle spasm: Secondary | ICD-10-CM | POA: Diagnosis present

## 2022-04-25 DIAGNOSIS — R279 Unspecified lack of coordination: Secondary | ICD-10-CM | POA: Insufficient documentation

## 2022-04-25 NOTE — Therapy (Addendum)
OUTPATIENT PHYSICAL THERAPY FEMALE PELVIC EVALUATION   Patient Name: Carla Flores MRN: [id number] DOB:July 20, 2001, 21 y.o., female Today's Date: 04/25/2022   PT End of Session - 04/25/22 1613     Visit Number 1    Date for PT Re-Evaluation 07/26/22    Authorization Type medicaid [employer]    PT Start Time 2951    PT Stop Time 1605    PT Time Calculation (min) 35 min             Past Medical History:  Diagnosis Date   Vaginal Pap smear, abnormal    Past Surgical History:  Procedure Laterality Date   NO PAST SURGERIES     Patient Active Problem List   Diagnosis Date Noted   S/P LEEP 02/08/2022   High grade squamous intraepithelial cervical dysplasia 11/09/2021   History of cervical dysplasia 10/05/2021   Screening examination for STD (sexually transmitted disease) 10/05/2021   Vaginal pain 10/05/2021   Dyspareunia, female 10/05/2021   Medical history non-contributory 06/11/2021   Elevated BP without diagnosis of hypertension 08/12/2020   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    PCP: None per chart  REFERRING PROVIDER: Gabriel Carina, CNM  REFERRING DIAG: N94.2 (ICD-10-CM) - Vaginismus  THERAPY DIAG:  Muscle weakness (generalized)  Other muscle spasm  Abnormal posture  Unspecified lack of coordination  Rationale for Evaluation and Treatment Rehabilitation  ONSET DATE: 1.5 years ago  SUBJECTIVE:                                                                                                                                                                                           SUBJECTIVE STATEMENT: Pt reports she has had internal pelvic pain since vaginal delivery of baby (1st) 1.5 years ago. Pt had 2nd degree tear and did have pain and tightness and heaviness with this but this improved and is no longer bothersome. However any attempts at vaginal penetration is high levels of pain. Pt reports she feels a lot of dryness.     PAIN:  Are you  having pain? Yes NPRS scale: 7/10 - with vaginal penetration  Pain location: Internal and Vaginal  Pain type: sharp Pain description: sharp and pinching  , friction  Aggravating factors: intercourse Relieving factors: not having sex  PRECAUTIONS: None  WEIGHT BEARING RESTRICTIONS No  FALLS:  Has patient fallen in last 6 months? 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  PLOF: Independent  PATIENT GOALS to have less pain  PERTINENT HISTORY:  High grade squamous intraepithelial cervical dysplasia, one vaginal birth  Sexual abuse: No  BOWEL MOVEMENT Pain with bowel movement: No Type of bowel movement:Type (Bristol Stool Scale) 4, Frequency daily, and Strain No Fully empty rectum: Yes:   Leakage: No Pads: No Fiber supplement: No  URINATION Pain with urination: No Fully empty bladder: Yes:   Stream: Strong Urgency: No Frequency: no more than every 3 hours Leakage:  no  Pads: No  INTERCOURSE Pain with intercourse: Initial Penetration, During Penetration, After Intercourse, and Pain Interrupts Intercourse Ability to have vaginal penetration:  Yes: but painful 7/10 Climax: can orgasm with external stimulation but not internal due to pain Marinoff Scale: 2/3  PREGNANCY Vaginal deliveries 1 Tearing Yes: 2nd degree C-section deliveries 0 Currently pregnant No  PROLAPSE None  OBJECTIVE:   DIAGNOSTIC FINDINGS:  PATIENT SURVEYS:   PFIQ-7 29  COGNITION:  Overall cognitive status: Within functional limits for tasks assessed     SENSATION:  Light touch: Appears intact  Proprioception: Appears intact  MUSCLE LENGTH: Bil hamstrings and adductors limited by 25%                POSTURE: forward head and posterior pelvic tilt   LUMBARAROM/PROM  WFL  LOWER EXTREMITY ROM:  Bil WFL  LOWER EXTREMITY MMT:  Bil hips grossly 3+/5; knees and ankles 4/5   PALPATION:   General  no  TTP, mild fascial tightness throughout abdomen without pain                External Perineal Exam no TTP, dryness noted                             Internal Pelvic Floor TTP at superficial muscle layer with pt reported high levels of pain with immediate palpation.    Patient confirms identification and approves PT to assess internal pelvic floor and treatment Yes  PELVIC MMT:   MMT eval  Vaginal 4/5; endurance and reps not tested due to pain  Internal Anal Sphincter   External Anal Sphincter   Puborectalis   Diastasis Recti Not noted  (Blank rows = not tested)        TONE: Increased   PROLAPSE: Not assessed due to pt's pain  TODAY'S TREATMENT    04/25/22 EVAL Examination completed, findings reviewed, pt educated on POC, HEP, and feminine lubricant and moisturizer handouts given. Pt motivated to participate in PT and agreeable to attempt recommendations.       If treatment provided at initial evaluation, no treatment charged due to lack of authorization.        PATIENT EDUCATION:  Education details: [id number] Person educated: Patient Education method: Consulting civil engineer, Demonstration, Corporate treasurer cues, Verbal cues, and Handouts Education comprehension: verbalized understanding and returned demonstration   HOME EXERCISE PROGRAM: [id number]  ASSESSMENT:  CLINICAL IMPRESSION: Patient is a 21 y.o. female  who was seen today for physical therapy evaluation and treatment for pelvic pain with vaginal penetration. Pt has pain with all vaginal penetration and has been present since birth of child 1.5 years ago, her only child. Pt is very high levels and stops attempts at intercourse often or causes pt pain during, after intercourse as well. Pt did have a 2nd degree tear with vaginal birth of child but this has helped and reports no longer painful. Pt found to have decreased hip and core strength, fascial restrictions at abdomen, and no TTP externally but did have dryness, pt reports she uses  lubricant during intercourse but this this does not  help. Pt did consent to internal vaginal assessment, no pain externally, did have have with palpation at superficial muscle layers, deeper layer not assessed due to pain, increased tension throughout. Internal stopped due to pain. Pt given handouts for HEP and feminine moisturizers and lubricants. Pt would benefit from additional PT to further address deficits.     OBJECTIVE IMPAIRMENTS decreased coordination, decreased endurance, decreased strength, increased fascial restrictions, increased muscle spasms, improper body mechanics, postural dysfunction, and pain.   ACTIVITY LIMITATIONS  intercourse  PARTICIPATION LIMITATIONS: interpersonal relationship  PERSONAL FACTORS 1 comorbidity: one vaginal birth  are also affecting patient's functional outcome.   REHAB POTENTIAL: Good  CLINICAL DECISION MAKING: Stable/uncomplicated  EVALUATION COMPLEXITY: Low   GOALS: Goals reviewed with patient? Yes  SHORT TERM GOALS: Target date: 05/23/2022  Pt to be I with HEP.  Baseline: Goal status: INITIAL  2.  Pt to report no more than 6/10 pain with vaginal penetration.  Baseline:  Goal status: INITIAL  3.  Pt to be able to tolerate insertion of size 3 vaginal dilator or equivalent with no more than 4/10 pain.  Baseline:  Goal status: INITIAL   LONG TERM GOALS: Target date: 07/26/2022   Pt to be I with advanced HEP.  Baseline:  Goal status: INITIAL  2.   Pt to report no more than 3/10 pain with vaginal penetration.  Baseline:  Goal status: INITIAL  3.  Pt to be able to tolerate insertion of size 5 vaginal dilator or equivalent with no more than 3/10 pain.  Baseline:  Goal status: INITIAL  4.  Pt to demonstrate improved pelvic floor mobility with ability to relax/contract/relax/bulge/relax for decreased pain.  Baseline:  Goal status: INITIAL    PLAN: PT FREQUENCY: 1x/week  PT DURATION:  8 sessions  PLANNED INTERVENTIONS:  Therapeutic exercises, Therapeutic activity, Neuromuscular re-education, Patient/Family education, Joint mobilization, Aquatic Therapy, Dry Needling, Spinal mobilization, Cryotherapy, Moist heat, Taping, Biofeedback, and Manual therapy  PLAN FOR NEXT SESSION: internal if needed, external manual   Stacy Gardner, PT, DPT 06/26/235:11 PM   PHYSICAL THERAPY DISCHARGE SUMMARY  Visits from Start of Care: 1  Current functional level related to goals / functional outcomes: Unable to return since evaluation therefore unable to formally reassess   Remaining deficits: Unable to formally reassess   Education / Equipment: HEP   Patient agrees to discharge. Patient goals were not met. Patient is being discharged due to not returning since the last visit.  Stacy Gardner, PT, DPT 08/15/232:28 PM

## 2022-04-27 ENCOUNTER — Ambulatory Visit (INDEPENDENT_AMBULATORY_CARE_PROVIDER_SITE_OTHER): Payer: Medicaid Other

## 2022-04-27 VITALS — BP 116/81 | HR 76 | Ht 66.0 in | Wt 153.0 lb

## 2022-04-27 DIAGNOSIS — Z3042 Encounter for surveillance of injectable contraceptive: Secondary | ICD-10-CM | POA: Diagnosis not present

## 2022-04-27 MED ORDER — MEDROXYPROGESTERONE ACETATE 150 MG/ML IM SUSP
150.0000 mg | Freq: Once | INTRAMUSCULAR | Status: AC
  Administered 2022-04-27: 150 mg via INTRAMUSCULAR

## 2022-04-27 NOTE — Progress Notes (Addendum)
SUBJECTIVE: Carla Flores is a 21 y.o. female who presents for DEPO Injection.   OBJECTIVE: Appears well, in no apparent distress.  Vital signs are normal.   ASSESSMENT:Need for [facility].  Last PAP 10/05/21  HSIL.  PLAN: DEPO Injection given in LD, tolerated well. Next DEPO due Sept. 13-27, 2023    Administrations This Visit     medroxyPROGESTERone (DEPO-PROVERA) injection 150 mg     Admin Date 04/27/2022 Action Given Dose 150 mg Route Intramuscular Administered By Maretta Bees, RMA

## 2022-04-27 NOTE — Progress Notes (Signed)
Patient was assessed and managed by nursing staff during this encounter. I have reviewed the chart and agree with the documentation and plan. I have also made any necessary editorial changes.  Catalina Antigua, MD 04/27/2022 4:33 PM

## 2022-05-17 ENCOUNTER — Ambulatory Visit: Payer: Medicaid Other | Attending: Certified Nurse Midwife | Admitting: Physical Therapy

## 2022-05-17 DIAGNOSIS — R293 Abnormal posture: Secondary | ICD-10-CM | POA: Insufficient documentation

## 2022-05-17 DIAGNOSIS — N942 Vaginismus: Secondary | ICD-10-CM | POA: Insufficient documentation

## 2022-05-17 DIAGNOSIS — R279 Unspecified lack of coordination: Secondary | ICD-10-CM | POA: Insufficient documentation

## 2022-05-17 DIAGNOSIS — M62838 Other muscle spasm: Secondary | ICD-10-CM | POA: Insufficient documentation

## 2022-05-17 DIAGNOSIS — M6281 Muscle weakness (generalized): Secondary | ICD-10-CM | POA: Insufficient documentation

## 2022-05-18 ENCOUNTER — Telehealth: Payer: Self-pay | Admitting: Physical Therapy

## 2022-05-18 NOTE — Telephone Encounter (Signed)
PT called pt about 05/17/22 appointment at 1615. Pt did not answer, voicemail left.   Otelia Sergeant, PT, DPT 07/19/238:56 AM

## 2022-05-22 ENCOUNTER — Ambulatory Visit (INDEPENDENT_AMBULATORY_CARE_PROVIDER_SITE_OTHER): Payer: Medicaid Other

## 2022-05-22 ENCOUNTER — Ambulatory Visit (HOSPITAL_COMMUNITY)
Admission: EM | Admit: 2022-05-22 | Discharge: 2022-05-22 | Disposition: A | Discharge status: Home / Self Care | Payer: Medicaid Other | Attending: Physician Assistant | Admitting: Physician Assistant

## 2022-05-22 DIAGNOSIS — S161XXA Strain of muscle, fascia and tendon at neck level, initial encounter: Secondary | ICD-10-CM | POA: Insufficient documentation

## 2022-05-22 DIAGNOSIS — M7918 Myalgia, other site: Secondary | ICD-10-CM | POA: Diagnosis not present

## 2022-05-22 DIAGNOSIS — R42 Dizziness and giddiness: Secondary | ICD-10-CM | POA: Diagnosis not present

## 2022-05-22 DIAGNOSIS — N3090 Cystitis, unspecified without hematuria: Secondary | ICD-10-CM | POA: Insufficient documentation

## 2022-05-22 DIAGNOSIS — M546 Pain in thoracic spine: Secondary | ICD-10-CM | POA: Diagnosis not present

## 2022-05-22 DIAGNOSIS — M542 Cervicalgia: Secondary | ICD-10-CM

## 2022-05-22 LAB — POCT URINALYSIS DIPSTICK, ED / UC
Bilirubin Urine: NEGATIVE
Glucose, UA: NEGATIVE mg/dL
Ketones, ur: NEGATIVE mg/dL
Nitrite: NEGATIVE
Protein, ur: NEGATIVE mg/dL
Specific Gravity, Urine: 1.02 (ref 1.005–1.030)
Urobilinogen, UA: 2 mg/dL — ABNORMAL HIGH (ref 0.0–1.0)
pH: 7.5 (ref 5.0–8.0)

## 2022-05-22 MED ORDER — NITROFURANTOIN MONOHYD MACRO 100 MG PO CAPS: 100.0000 mg | ORAL_CAPSULE | Freq: Two times a day (BID) | ORAL | 0 refills | Status: DC

## 2022-05-22 MED ORDER — TIZANIDINE HCL 4 MG PO TABS: 4.0000 mg | ORAL_TABLET | Freq: Four times a day (QID) | ORAL | 0 refills | Status: DC | PRN

## 2022-05-22 MED ORDER — IBUPROFEN 600 MG PO TABS
600.0000 mg | ORAL_TABLET | Freq: Three times a day (TID) | ORAL | 0 refills | Status: AC
Start: 2022-05-22 — End: ?

## 2022-05-22 NOTE — Discharge Instructions (Signed)
Advised to take the ibuprofen 600 mg 1 every 8 hours with food to help reduce the back pain. Advised take Zanaflex 1 every 6 hours as a muscle relaxant to help decrease muscle spasm. Advised to use ice therapy, 10 minutes on 20 minutes off, to help reduce muscle spasm and pain. Advised to follow-up with PCP or return to urgent care if symptoms fail to improve.
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
## 2022-05-23 LAB — URINE CULTURE

## 2022-05-31 ENCOUNTER — Ambulatory Visit: Payer: Medicaid Other | Attending: Certified Nurse Midwife | Admitting: Physical Therapy

## 2022-05-31 ENCOUNTER — Telehealth: Payer: Self-pay | Admitting: Physical Therapy

## 2022-05-31 DIAGNOSIS — N942 Vaginismus: Secondary | ICD-10-CM | POA: Insufficient documentation

## 2022-05-31 DIAGNOSIS — R279 Unspecified lack of coordination: Secondary | ICD-10-CM | POA: Insufficient documentation

## 2022-05-31 DIAGNOSIS — M62838 Other muscle spasm: Secondary | ICD-10-CM | POA: Insufficient documentation

## 2022-05-31 DIAGNOSIS — M6281 Muscle weakness (generalized): Secondary | ICD-10-CM | POA: Insufficient documentation

## 2022-05-31 DIAGNOSIS — R293 Abnormal posture: Secondary | ICD-10-CM | POA: Insufficient documentation

## 2022-05-31 NOTE — Telephone Encounter (Signed)
PT called pt about this afternoon appointment at 1400. Pt reported she was unable to get a ride to appointment, understands attendance policy and reports she will be at next visit, if pt unable to make appointment with no show pt will unfortunately be discharged from PT.   Otelia Sergeant, PT, DPT 08/01/232:24 PM

## 2022-06-14 ENCOUNTER — Telehealth: Payer: Self-pay | Admitting: Physical Therapy

## 2022-06-14 ENCOUNTER — Ambulatory Visit: Payer: Medicaid Other | Admitting: Physical Therapy

## 2022-06-14 NOTE — Telephone Encounter (Signed)
PT called pt about this morning's appointment at 1400. Pt did not answer, voicemail left.  Unfortunately, due to attendance policy will be discharged from PT.   Otelia Sergeant, PT, DPT 08/15/232:28 PM

## 2022-06-16 ENCOUNTER — Encounter: Payer: Self-pay | Admitting: Obstetrics and Gynecology

## 2022-06-28 ENCOUNTER — Encounter: Payer: Medicaid Other | Admitting: Physical Therapy

## 2022-07-12 ENCOUNTER — Encounter: Payer: Medicaid Other | Admitting: Physical Therapy

## 2022-07-18 ENCOUNTER — Ambulatory Visit: Payer: Medicaid Other

## 2022-07-20 ENCOUNTER — Ambulatory Visit: Payer: Medicaid Other

## 2022-07-26 ENCOUNTER — Encounter: Payer: Medicaid Other | Admitting: Physical Therapy

## 2022-08-09 ENCOUNTER — Encounter: Payer: Medicaid Other | Admitting: Physical Therapy

## 2022-08-23 ENCOUNTER — Encounter: Payer: Medicaid Other | Admitting: Physical Therapy

## 2022-12-19 ENCOUNTER — Ambulatory Visit (INDEPENDENT_AMBULATORY_CARE_PROVIDER_SITE_OTHER): Payer: Medicaid Other

## 2022-12-19 DIAGNOSIS — Z3202 Encounter for pregnancy test, result negative: Secondary | ICD-10-CM | POA: Diagnosis not present

## 2022-12-19 DIAGNOSIS — Z3042 Encounter for surveillance of injectable contraceptive: Secondary | ICD-10-CM

## 2022-12-19 DIAGNOSIS — Z30013 Encounter for initial prescription of injectable contraceptive: Secondary | ICD-10-CM

## 2022-12-19 LAB — POCT URINE PREGNANCY: Preg Test, Ur: NEGATIVE

## 2022-12-19 MED ORDER — MEDROXYPROGESTERONE ACETATE 150 MG/ML IM SUSP: 150.0000 mg | INTRAMUSCULAR | 0 refills | Status: DC

## 2022-12-19 NOTE — Progress Notes (Signed)
Subjective: Initial UPT to restart Depo Provera injection. Last injection given 04/27/22.  Objective: Need for contraception. No unusual complaints.    Assessment: 1st UPT negative   Plan: Abstain from IC x 2 weeks, repeat UPT and if it's negative, we will administer the injection. Pt agrees to bring Depo rx to next  appt.   Schedule pap/annual during checkout today.

## 2023-01-02 ENCOUNTER — Ambulatory Visit (INDEPENDENT_AMBULATORY_CARE_PROVIDER_SITE_OTHER): Payer: Medicaid Other

## 2023-01-02 VITALS — BP 110/70 | HR 80 | Wt 156.0 lb

## 2023-01-02 DIAGNOSIS — Z3042 Encounter for surveillance of injectable contraceptive: Secondary | ICD-10-CM

## 2023-01-02 LAB — POCT URINE PREGNANCY: Preg Test, Ur: NEGATIVE

## 2023-01-02 MED ORDER — MEDROXYPROGESTERONE ACETATE 150 MG/ML IM SUSP: 150.0000 mg | INTRAMUSCULAR | 0 refills | Status: DC

## 2023-01-02 MED ORDER — MEDROXYPROGESTERONE ACETATE 150 MG/ML IM SUSP
150.0000 mg | Freq: Once | INTRAMUSCULAR | Status: AC
  Administered 2023-01-02: 150 mg via INTRAMUSCULAR

## 2023-01-02 NOTE — Progress Notes (Signed)
Subjective: Pt in for 2nd UPT to restart Depo Provera. Denies unprotected IC x 14 days.  Objective: Need for contraception. No unusual complaints.    Assessment: Office supply Depo given L Del. Pt tolerated Depo injection.   Plan:  Next injection due May 20-Jun 3.   Pt agrees to bring rx to the office for the next injection.

## 2023-01-19 ENCOUNTER — Ambulatory Visit (INDEPENDENT_AMBULATORY_CARE_PROVIDER_SITE_OTHER): Payer: Medicaid Other | Admitting: Student

## 2023-01-19 ENCOUNTER — Other Ambulatory Visit (HOSPITAL_COMMUNITY)
Admission: RE | Admit: 2023-01-19 | Discharge: 2023-01-19 | Disposition: A | Discharge status: Home / Self Care | Payer: Medicaid Other | Source: Ambulatory Visit | Attending: Student | Admitting: Student

## 2023-01-19 ENCOUNTER — Encounter: Payer: Self-pay | Admitting: Student

## 2023-01-19 VITALS — BP 120/74 | HR 80 | Ht 66.0 in | Wt 156.2 lb

## 2023-01-19 DIAGNOSIS — N942 Vaginismus: Secondary | ICD-10-CM | POA: Diagnosis not present

## 2023-01-19 DIAGNOSIS — N941 Unspecified dyspareunia: Secondary | ICD-10-CM | POA: Diagnosis not present

## 2023-01-19 DIAGNOSIS — Z1339 Encounter for screening examination for other mental health and behavioral disorders: Secondary | ICD-10-CM

## 2023-01-19 DIAGNOSIS — Z01419 Encounter for gynecological examination (general) (routine) without abnormal findings: Secondary | ICD-10-CM

## 2023-01-19 NOTE — Progress Notes (Signed)
ANNUAL EXAM Patient name: Carla Flores MRN [id number]  Date of birth: Feb 04, 2001 Chief Complaint:   No chief complaint on file.  History of Present Illness:   Carla Flores is a 22 y.o. G41P1001 African-American female being seen today for a routine annual exam.  Current complaints: vaginal dryness and discomfort during sexual intercourse  No LMP recorded. Patient has had an injection.   Upstream - 01/19/23 1432       Pregnancy Intention Screening   Does the patient want to become pregnant in the next year? No    Does the patient's partner want to become pregnant in the next year? No    Would the patient like to discuss contraceptive options today? No            The pregnancy intention screening data noted above was reviewed. Potential methods of contraception were discussed. The patient elected to proceed with No data recorded.   Last pap 2022. Results were: HSIL w/ HRHPV not done. H/O abnormal pap: yes Last mammogram: n/a. Results were: N/A.  Last colonoscopy: n/a. Results were: N/A.      01/19/2023    2:31 PM 02/19/2020    9:09 AM  Depression screen PHQ 2/9  Decreased Interest 0 3  Down, Depressed, Hopeless 0 1  PHQ - 2 Score 0 4  Altered sleeping 0 1  Tired, decreased energy 0 1  Change in appetite 0 1  Feeling bad or failure about yourself  0 0  Trouble concentrating 0 0  Moving slowly or fidgety/restless 0 0  Suicidal thoughts 0 0  PHQ-9 Score 0 7        01/19/2023    2:32 PM  GAD 7 : Generalized Anxiety Score  Nervous, Anxious, on Edge 0  Control/stop worrying 0  Worry too much - different things 0  Trouble relaxing 0  Restless 0  Easily annoyed or irritable 0  Afraid - awful might happen 0  Total GAD 7 Score 0     Review of Systems:   Pertinent items are noted in HPI Denies any headaches, blurred vision, fatigue, shortness of breath, chest pain, abdominal pain, abnormal vaginal discharge/itching/odor/irritation, problems with  periods, bowel movements, urination, or intercourse unless otherwise stated above. Pertinent History Reviewed:  Reviewed past medical,surgical, social and family history.  Reviewed problem list, medications and allergies. Physical Assessment:   Vitals:   01/19/23 1428  BP: 120/74  Pulse: 80  Weight: 156 lb 3.2 oz (70.9 kg)  Height: 5\' 6"  (1.676 m)  Body mass index is 25.21 kg/m.        Physical Examination:   General appearance - well appearing, and in no distress  Mental status - alert, oriented to person, place, and time  Psych:  She has a normal mood and affect  Skin - warm and dry, normal color, no suspicious lesions noted  Chest - effort normal  Heart - normal rate and regular rhythm  Neck:  midline trachea, no thyromegaly or nodules  Breasts - not examined  Abdomen - soft, nontender, nondistended, no masses or organomegaly  Pelvic - VULVA: normal appearing vulva with no masses, tenderness or lesions  VAGINA: normal appearing vagina with normal color and discharge, no lesions CERVIX: normal appearing cervix without discharge or lesions  Thin prep pap is done with HR HPV cotesting  Extremities:  No swelling or varicosities noted  Chaperone present for exam  No results found for this or any previous visit (from the past  24 hour(s)).  Assessment & Plan:  1. Women's annual routine gynecological examination - Cytology - PAP( [place]) - Cervicovaginal ancillary only( [place]) - HIV antibody (with reflex) - RPR - Hepatitis C Antibody - Hepatitis B Surface AntiGEN  2. Vaginismus 3. Dyspareunia, female - Support and reassurance provided to patient. Discussed methods/options for supporting sexual wellness. Recommend follow-up in 2-3 months to assess improvement in experiences. Discussed exploring other contraception methods should things not improve, as Depo can impact libido.   Labs/procedures today: see below  Mammogram: @ 24yo, or sooner if problems Colonoscopy:  @ 24yo, or sooner if problems  Orders Placed This Encounter  Procedures   HIV antibody (with reflex)   RPR   Hepatitis C Antibody   Hepatitis B Surface AntiGEN    Meds: No orders of the defined types were placed in this encounter.   Follow-up: Return in about 3 months (around 04/21/2023), or if symptoms worsen or fail to improve.  Johnston Ebbs, NP 01/19/2023 4:03 PM

## 2023-01-19 NOTE — Progress Notes (Signed)
Pt presents for AEX. Hx of abnormal PAP. Requesting refill of Depo and STD testing. Pt c/o vaginal dryness. No other concerns.

## 2023-01-20 LAB — RPR: RPR Ser Ql: NONREACTIVE

## 2023-01-20 LAB — HIV ANTIBODY (ROUTINE TESTING W REFLEX): HIV Screen 4th Generation wRfx: NONREACTIVE

## 2023-01-20 LAB — HEPATITIS C ANTIBODY: Hep C Virus Ab: NONREACTIVE

## 2023-01-20 LAB — HEPATITIS B SURFACE ANTIGEN: Hepatitis B Surface Ag: NEGATIVE

## 2023-01-23 LAB — CERVICOVAGINAL ANCILLARY ONLY
Chlamydia: NEGATIVE
Comment: NEGATIVE
Comment: NEGATIVE
Comment: NORMAL
Neisseria Gonorrhea: NEGATIVE
Trichomonas: NEGATIVE

## 2023-01-25 LAB — CYTOLOGY - PAP
Comment: NEGATIVE
Diagnosis: UNDETERMINED — AB
High risk HPV: NEGATIVE

## 2023-03-28 ENCOUNTER — Ambulatory Visit: Payer: Medicaid Other

## 2023-04-03 ENCOUNTER — Ambulatory Visit (INDEPENDENT_AMBULATORY_CARE_PROVIDER_SITE_OTHER): Payer: Medicaid Other

## 2023-04-03 ENCOUNTER — Other Ambulatory Visit (HOSPITAL_COMMUNITY)
Admission: RE | Admit: 2023-04-03 | Discharge: 2023-04-03 | Disposition: A | Discharge status: Home / Self Care | Payer: Medicaid Other | Source: Ambulatory Visit | Attending: Obstetrics and Gynecology | Admitting: Obstetrics and Gynecology

## 2023-04-03 DIAGNOSIS — R350 Frequency of micturition: Secondary | ICD-10-CM | POA: Diagnosis not present

## 2023-04-03 DIAGNOSIS — R82998 Other abnormal findings in urine: Secondary | ICD-10-CM | POA: Diagnosis not present

## 2023-04-03 DIAGNOSIS — N926 Irregular menstruation, unspecified: Secondary | ICD-10-CM | POA: Diagnosis not present

## 2023-04-03 DIAGNOSIS — Z3042 Encounter for surveillance of injectable contraceptive: Secondary | ICD-10-CM

## 2023-04-03 DIAGNOSIS — N898 Other specified noninflammatory disorders of vagina: Secondary | ICD-10-CM | POA: Insufficient documentation

## 2023-04-03 LAB — POCT URINALYSIS DIPSTICK
Bilirubin, UA: NEGATIVE
Blood, UA: NEGATIVE
Glucose, UA: NEGATIVE
Ketones, UA: NEGATIVE
Nitrite, UA: NEGATIVE
Protein, UA: NEGATIVE
Spec Grav, UA: 1.025 (ref 1.010–1.025)
Urobilinogen, UA: 0.2 E.U./dL
pH, UA: 6 (ref 5.0–8.0)

## 2023-04-03 LAB — POCT URINE PREGNANCY: Preg Test, Ur: NEGATIVE

## 2023-04-03 MED ORDER — NITROFURANTOIN MONOHYD MACRO 100 MG PO CAPS: 100.0000 mg | ORAL_CAPSULE | Freq: Two times a day (BID) | ORAL | 1 refills | Status: DC

## 2023-04-03 MED ORDER — MEDROXYPROGESTERONE ACETATE 150 MG/ML IM SUSY
150.0000 mg | PREFILLED_SYRINGE | Freq: Once | INTRAMUSCULAR | Status: AC
  Administered 2023-04-03: 150 mg via INTRAMUSCULAR

## 2023-04-03 MED ORDER — METRONIDAZOLE 500 MG PO TABS: 500.0000 mg | ORAL_TABLET | Freq: Two times a day (BID) | ORAL | 0 refills | Status: DC

## 2023-04-03 NOTE — Progress Notes (Signed)
SUBJECTIVE:  22 y.o. female complains of malodorous vaginal discharge for 1 month(s). Denies abnormal vaginal bleeding or significant pelvic pain or fever. No UTI symptoms. Denies history of known exposure to STD.  No LMP recorded. Patient has had an injection.  OBJECTIVE:  She appears well, afebrile. Urine dipstick: positive for WBC's.  ASSESSMENT:  Vaginal Discharge  Vaginal Odor   PLAN:  GC, chlamydia, trichomonas, BVAG, CVAG probe sent to lab. Treatment: To be determined once lab results are received ROV prn if symptoms persist or worsen.   Date last pap: 01/19/23. Last Depo-Provera: 01/02/23. Side Effects if any: N/A. Serum HCG indicated? N/A. Depo-Provera 150 mg IM given by: Dreama Saa RN,. Injection given in LD. Patient tolerated well. Next appointment due Aug 19- Sep 2.   Macrobid sent to pharmacy per protocol for UTI sx. Urine Culture sent

## 2023-04-04 LAB — CERVICOVAGINAL ANCILLARY ONLY
Bacterial Vaginitis (gardnerella): NEGATIVE
Candida Glabrata: NEGATIVE
Candida Vaginitis: NEGATIVE
Chlamydia: NEGATIVE
Comment: NEGATIVE
Comment: NEGATIVE
Comment: NEGATIVE
Comment: NEGATIVE
Comment: NEGATIVE
Comment: NORMAL
Neisseria Gonorrhea: NEGATIVE
Trichomonas: NEGATIVE

## 2023-04-05 LAB — URINE CULTURE

## 2023-06-27 ENCOUNTER — Ambulatory Visit (INDEPENDENT_AMBULATORY_CARE_PROVIDER_SITE_OTHER): Payer: Medicaid Other | Admitting: Emergency Medicine

## 2023-06-27 VITALS — BP 123/87 | HR 84 | Ht 66.0 in | Wt 163.2 lb

## 2023-06-27 DIAGNOSIS — Z3042 Encounter for surveillance of injectable contraceptive: Secondary | ICD-10-CM

## 2023-06-27 MED ORDER — MEDROXYPROGESTERONE ACETATE 150 MG/ML IM SUSP: 150.0000 mg | INTRAMUSCULAR | 2 refills | Status: DC

## 2023-06-27 MED ORDER — MEDROXYPROGESTERONE ACETATE 150 MG/ML IM SUSP
150.0000 mg | Freq: Once | INTRAMUSCULAR | Status: AC
  Administered 2023-06-27: 150 mg via INTRAMUSCULAR

## 2023-06-27 NOTE — Progress Notes (Signed)
Date last pap: 01/19/2023. Last Depo-Provera: 04/03/2023. Side Effects if any: None. Serum HCG indicated? No. Depo-Provera 150 mg IM given by: Resa Miner, RN into LD, tolerated well in office. Next appointment due Nov 12-Nov 26.

## 2023-09-18 ENCOUNTER — Other Ambulatory Visit: Payer: Self-pay | Admitting: Obstetrics and Gynecology

## 2023-09-18 ENCOUNTER — Ambulatory Visit: Payer: Medicaid Other

## 2023-09-18 DIAGNOSIS — Z3042 Encounter for surveillance of injectable contraceptive: Secondary | ICD-10-CM

## 2023-09-22 ENCOUNTER — Ambulatory Visit: Payer: Medicaid Other

## 2023-09-22 VITALS — BP 118/86 | HR 86

## 2023-09-22 DIAGNOSIS — Z3042 Encounter for surveillance of injectable contraceptive: Secondary | ICD-10-CM

## 2023-09-22 MED ORDER — MEDROXYPROGESTERONE ACETATE 150 MG/ML IM SUSP
150.0000 mg | INTRAMUSCULAR | Status: DC
  Administered 2023-09-22 – 2024-03-08 (×2): 150 mg via INTRAMUSCULAR

## 2023-09-22 NOTE — Progress Notes (Signed)
Pt is in the office for depo injection. Administered in L Del, per pt request, and pt tolerated well. Next due Feb 7- 21. .. Administrations This Visit     medroxyPROGESTERone (DEPO-PROVERA) injection 150 mg     Admin Date 09/22/2023 Action Given Dose 150 mg Route Intramuscular Documented By Katrina Stack, RN

## 2023-12-14 ENCOUNTER — Ambulatory Visit: Payer: Medicaid Other

## 2023-12-14 ENCOUNTER — Ambulatory Visit: Payer: Medicaid Other | Admitting: General Practice

## 2023-12-14 VITALS — BP 128/83 | HR 81 | Ht 66.0 in | Wt 168.0 lb

## 2023-12-14 DIAGNOSIS — Z3042 Encounter for surveillance of injectable contraceptive: Secondary | ICD-10-CM | POA: Diagnosis not present

## 2023-12-14 MED ORDER — MEDROXYPROGESTERONE ACETATE 150 MG/ML IM SUSP
150.0000 mg | Freq: Once | INTRAMUSCULAR | Status: AC
  Administered 2023-12-14: 150 mg via INTRAMUSCULAR

## 2023-12-14 NOTE — Progress Notes (Signed)
Date last pap: 01-19-23. Last Depo-Provera: 09-22-23. Side Effects if any: N/A pt tolerated well. Serum HCG indicated? N/A Depo given on schedule. Depo-Provera 150 mg IM given by: Hope Pigeon, CMA in the LD per pt request. Next appointment due 5/1-5/15.   Advised pt to schedule AEX.

## 2024-02-05 ENCOUNTER — Encounter: Payer: Self-pay | Admitting: Obstetrics and Gynecology

## 2024-02-05 ENCOUNTER — Ambulatory Visit: Payer: Medicaid Other | Admitting: Obstetrics and Gynecology

## 2024-02-05 ENCOUNTER — Other Ambulatory Visit (HOSPITAL_COMMUNITY)
Admission: RE | Admit: 2024-02-05 | Discharge: 2024-02-05 | Disposition: A | Discharge status: Home / Self Care | Source: Ambulatory Visit | Attending: Obstetrics and Gynecology | Admitting: Obstetrics and Gynecology

## 2024-02-05 VITALS — BP 117/79 | HR 81 | Ht 66.0 in | Wt 169.0 lb

## 2024-02-05 DIAGNOSIS — Z01419 Encounter for gynecological examination (general) (routine) without abnormal findings: Secondary | ICD-10-CM

## 2024-02-05 MED ORDER — MEDROXYPROGESTERONE ACETATE 150 MG/ML IM SUSP: 150.0000 mg | INTRAMUSCULAR | 4 refills | Status: DC

## 2024-02-05 NOTE — Progress Notes (Signed)
 Pt would like all testing, she is having some d/c with odor.

## 2024-02-05 NOTE — Progress Notes (Signed)
 Subjective:     Carla Flores is a 23 y.o. P1 with amenorrhea related to depo-provera and BMI 27 who is here for a comprehensive physical exam. The patient reports presence of non-pruritic discharge with odor. Patient is sexually active reporting vaginal dryness corrected with the use of water-base lubricant. Patient was seen by pelvic physical therapy as well due to pain with insertion and self discontinued but will consider returning. Patient is without any other complaints. She denies urinary incontinence or issues with bowel movements.   Past Medical History:  Diagnosis Date   Vaginal Pap smear, abnormal    Past Surgical History:  Procedure Laterality Date   NO PAST SURGERIES     Family History  Problem Relation Age of Onset   Diabetes Maternal Grandmother     Social History   Socioeconomic History   Marital status: Single    Spouse name: Not on file   Number of children: Not on file   Years of education: Not on file   Highest education level: Not on file  Occupational History   Not on file  Tobacco Use   Smoking status: Never    Passive exposure: Never   Smokeless tobacco: Never  Vaping Use   Vaping status: Never Used  Substance and Sexual Activity   Alcohol use: Yes    Comment: occ   Drug use: Never   Sexual activity: Yes    Partners: Male    Birth control/protection: Injection  Other Topics Concern   Not on file  Social History Narrative   Not on file   Social Drivers of Health   Financial Resource Strain: Not on File (06/09/2023)   Received from [employer]    Financial Resource Strain: 0  Food Insecurity: Not on file (07/06/2023)  Transportation Needs: Not on File (06/09/2023)   Received from [employer] [facility]    Transportation: 0  Physical Activity: Not on File (06/09/2023)   Received from [facility]   Physical Activity    Physical Activity: 0  Stress: Not on File (06/09/2023)   Received from [facility]   Stress    Stress:  0  Social Connections: Not on File (06/09/2023)   Received from [facility]   Social Connections    Social Connections and Isolation: 0  Intimate Partner Violence: Not on file   Health Maintenance  Topic Date Due   HPV VACCINES (1 - 3-dose series) Never done   DTaP/Tdap/Td (1 - Tdap) Never done   COVID-19 Vaccine (1 - 2024-25 season) Never done   CHLAMYDIA SCREENING  04/02/2024   INFLUENZA VACCINE  05/31/2024   Cervical Cancer Screening (Pap smear)  01/18/2026   Hepatitis C Screening  Completed   HIV Screening  Completed       Review of Systems Pertinent items noted in HPI and remainder of comprehensive ROS otherwise negative.   Objective:  Blood pressure 117/79, pulse 81, height 5\' 6"  (1.676 m), weight 169 lb (76.7 kg).   GENERAL: Well-developed, well-nourished female in no acute distress.  HEENT: Normocephalic, atraumatic. Sclerae anicteric.  NECK: Supple. Normal thyroid.  LUNGS: Clear to auscultation bilaterally.  HEART: Regular rate and rhythm. BREASTS: Symmetric in size. No palpable masses or lymphadenopathy, skin changes, or nipple drainage. ABDOMEN: Soft, nontender, nondistended. No organomegaly. PELVIC: Normal external female genitalia. Vagina is pink and rugated.  Normal discharge. Normal appearing cervix. Uterus is normal in size. No adnexal mass or tenderness. Chaperone present during the pelvic exam EXTREMITIES:  No cyanosis, clubbing, or edema, 2+ distal pulses.     Assessment:    Healthy female exam.      Plan:    Pap smear collected Vaginal swab for STI screening. Patient declined blood work Patient will be contacted with abnormal results Discussed other forms of contraception. Patient is considering IUD See After Visit Summary for Counseling Recommendations

## 2024-02-06 LAB — CERVICOVAGINAL ANCILLARY ONLY
Bacterial Vaginitis (gardnerella): NEGATIVE
Candida Glabrata: NEGATIVE
Candida Vaginitis: NEGATIVE
Chlamydia: NEGATIVE
Comment: NEGATIVE
Comment: NEGATIVE
Comment: NEGATIVE
Comment: NEGATIVE
Comment: NEGATIVE
Comment: NORMAL
Neisseria Gonorrhea: NEGATIVE
Trichomonas: NEGATIVE

## 2024-02-08 LAB — CYTOLOGY - PAP
Comment: NEGATIVE
Diagnosis: NEGATIVE
High risk HPV: NEGATIVE

## 2024-03-08 ENCOUNTER — Ambulatory Visit: Payer: Medicaid Other

## 2024-03-08 VITALS — BP 119/86 | HR 96 | Wt 169.8 lb

## 2024-03-08 DIAGNOSIS — Z3042 Encounter for surveillance of injectable contraceptive: Secondary | ICD-10-CM

## 2024-03-08 NOTE — Progress Notes (Signed)
 Pt is in the office for depo injection. Administered in and pt tolerated well. Next due July 25- Aug 8 .. Administrations This Visit     medroxyPROGESTERone  (DEPO-PROVERA ) injection 150 mg     Admin Date 03/08/2024 Action Given Dose 150 mg Route Intramuscular Documented By Sarrah Cure, RN

## 2024-05-27 ENCOUNTER — Ambulatory Visit

## 2024-05-27 VITALS — BP 123/84 | HR 72

## 2024-05-27 DIAGNOSIS — Z3042 Encounter for surveillance of injectable contraceptive: Secondary | ICD-10-CM | POA: Diagnosis not present

## 2024-05-27 MED ORDER — MEDROXYPROGESTERONE ACETATE 150 MG/ML IM SUSP
150.0000 mg | Freq: Once | INTRAMUSCULAR | Status: AC
  Administered 2024-05-27: 150 mg via INTRAMUSCULAR

## 2024-05-27 NOTE — Progress Notes (Signed)
 Date last pap: 02/05/24. Last Depo-Provera : 03/08/24. Side Effects if any: N/A. Serum HCG indicated? N/A. Depo-Provera  150 mg IM given by: Margy Pizza, RN in left deltoid. Next appointment due October 13- August 26, 2024.

## 2024-08-12 ENCOUNTER — Ambulatory Visit: Payer: Self-pay

## 2024-10-16 ENCOUNTER — Ambulatory Visit (HOSPITAL_COMMUNITY)
Admission: EM | Admit: 2024-10-16 | Discharge: 2024-10-16 | Disposition: A | Discharge status: Home / Self Care | Source: Home / Self Care | Attending: Family Medicine | Admitting: Family Medicine

## 2024-10-16 ENCOUNTER — Encounter (HOSPITAL_COMMUNITY): Payer: Self-pay | Admitting: Emergency Medicine

## 2024-10-16 ENCOUNTER — Other Ambulatory Visit: Payer: Self-pay

## 2024-10-16 DIAGNOSIS — R109 Unspecified abdominal pain: Secondary | ICD-10-CM

## 2024-10-16 DIAGNOSIS — Z3202 Encounter for pregnancy test, result negative: Secondary | ICD-10-CM | POA: Diagnosis not present

## 2024-10-16 DIAGNOSIS — N898 Other specified noninflammatory disorders of vagina: Secondary | ICD-10-CM | POA: Diagnosis not present

## 2024-10-16 LAB — POCT URINE DIPSTICK
Blood, UA: NEGATIVE
Glucose, UA: NEGATIVE mg/dL
Leukocytes, UA: NEGATIVE
Nitrite, UA: NEGATIVE
POC PROTEIN,UA: NEGATIVE
Spec Grav, UA: 1.02 (ref 1.010–1.025)
Urobilinogen, UA: 1 U/dL
pH, UA: 6 (ref 5.0–8.0)

## 2024-10-16 LAB — POCT URINE PREGNANCY: Preg Test, Ur: NEGATIVE

## 2024-10-16 NOTE — ED Provider Notes (Signed)
 [facility]    CSN: [id number] Arrival date & time: 10/16/24  1313      History   Chief Complaint Chief Complaint  Patient presents with   SEXUALLY TRANSMITTED DISEASE    HPI Carla Flores is a 23 y.o. female.   Patient is here for abdominal pain and vaginal discharge.  She had unprotected intercourse on the 11th, and 2 days later started having abdominal pain/cramping.  She did notice a fishy odor as well.  H/o BV, but not sure this feels the same.  Also h/o chlamydia.  No known exposures.  LMP was 11/12.  She did change birth control and having irregular period as a result.  No n/v.  No urinary symptoms.        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 Authorizing Provider  ibuprofen  (ADVIL ) 600 MG tablet Take 1 tablet (600 mg total) by mouth 3 (three) times daily. Patient not taking: Reported on 05/27/2024 05/22/22   Lynwood Lenis, PA-C  medroxyPROGESTERone  (DEPO-PROVERA ) 150 MG/ML injection Inject 1 mL (150 mg total) into the muscle every 3 (three) months. Patient not taking: Reported on  05/27/2024 11/17/21   Rudy Carlin LABOR, MD  medroxyPROGESTERone  (DEPO-PROVERA ) 150 MG/ML injection Inject 1 mL (150 mg total) into the muscle every 3 (three) months. 01/02/23   Constant, Peggy, MD  medroxyPROGESTERone  (DEPO-PROVERA ) 150 MG/ML injection Inject 1 mL (150 mg total) into the muscle every 3 (three) months. Patient not taking: Reported on 05/27/2024 02/05/24   Constant, Peggy, MD  tiZANidine  (ZANAFLEX ) 4 MG tablet Take 1 tablet (4 mg total) by mouth every 6 (six) hours as needed for muscle spasms. Patient not taking: Reported on 05/27/2024 05/22/22   Lynwood Lenis, PA-C    Family History Family History  Problem Relation Age of Onset   Diabetes Maternal Grandmother     Social History Social History[1]   Allergies   Patient has no known allergies.   Review of Systems Review of Systems  Constitutional: Negative.   HENT: Negative.    Respiratory: Negative.    Cardiovascular: Negative.   Gastrointestinal:  Positive for abdominal pain.  Genitourinary:  Positive for vaginal discharge.     Physical Exam Triage Vital Signs ED Triage Vitals  Encounter Vitals Group     BP 10/16/24 1406 118/86     Girls Systolic  BP Percentile --      Girls Diastolic BP Percentile --      Boys Systolic BP Percentile --      Boys Diastolic BP Percentile --      Pulse Rate 10/16/24 1406 79     Resp 10/16/24 1406 18     Temp 10/16/24 1406 98.2 F (36.8 C)     Temp Source 10/16/24 1406 Oral     SpO2 10/16/24 1406 96 %     Weight --      Height --      Head Circumference --      Peak Flow --      Pain Score 10/16/24 1402 6     Pain Loc --      Pain Education --      Exclude from Growth Chart --    No data found.  Updated Vital Signs BP 118/86 (BP Location: Left Arm)   Pulse 79   Temp 98.2 F (36.8 C) (Oral)   Resp 18   LMP 09/11/2024   SpO2 96%   Visual Acuity Right Eye Distance:   Left Eye Distance:   Bilateral Distance:    Right Eye Near:   Left Eye Near:    Bilateral  Near:     Physical Exam Constitutional:      General: She is not in acute distress.    Appearance: Normal appearance. She is normal weight. She is not ill-appearing or toxic-appearing.  Cardiovascular:     Rate and Rhythm: Normal rate and regular rhythm.  Pulmonary:     Effort: Pulmonary effort is normal.     Breath sounds: Normal breath sounds.  Abdominal:     Palpations: Abdomen is soft.     Tenderness: There is abdominal tenderness in the suprapubic area. There is no right CVA tenderness, left CVA tenderness, guarding or rebound.  Neurological:     Mental Status: She is alert.      UC Treatments / Results  Labs (all labs ordered are listed, but only abnormal results are displayed) Labs Reviewed  POCT URINE DIPSTICK - Abnormal; Notable for the following components:      Result Value   Clarity, UA hazy (*)    Bilirubin, UA small (*)    Ketones, POC UA moderate (40) (*)    All other components within normal limits  POCT URINE PREGNANCY  CERVICOVAGINAL ANCILLARY ONLY   UPT negative  EKG   Radiology No results found.  Procedures Procedures (including critical care time)  Medications Ordered in UC Medications - No data to display  Initial Impression / Assessment and Plan / UC Course  I have reviewed the triage vital signs and the nursing notes.  Pertinent labs & imaging results that were available during my care of the patient were reviewed by me and considered in my medical decision making (see chart for details).    Final Clinical Impressions(s) / UC Diagnoses   Final diagnoses:  Abdominal pain, unspecified abdominal location  Vaginal discharge     Discharge Instructions      You were seen today for abdominal pain and vaginal discharge.  Your urine was normal, negative for pregnancy.  We will wait until your vaginal swab is resulted tomorrow for any treatment.  You will see these results on mychart, and you will be called with any positive results for  treatment.      ED Prescriptions   None    PDMP not reviewed this encounter.    [  1]  Social History Tobacco Use   Smoking status: Never    Passive exposure: Never   Smokeless tobacco: Never  Vaping Use   Vaping status: Never Used  Substance Use Topics   Alcohol use: Yes    Comment: occ   Drug use: Never     Darral Longs, MD 10/16/24 1427

## 2024-10-16 NOTE — ED Triage Notes (Signed)
 Lower abdominal cramping like I'm on my period  symptoms started Saturday.  Patient reports a fishy odor.  Patient has a vaginal discharge.    Missed her depo shot in October 25  11/12 was the first day of last period and ended on 12/19.  This was the first cycle after missing depo shot in october

## 2024-10-16 NOTE — Discharge Instructions (Signed)
 You were seen today for abdominal pain and vaginal discharge.  Your urine was normal, negative for pregnancy.  We will wait until your vaginal swab is resulted tomorrow for any treatment.  You will see these results on mychart, and you will be called with any positive results for treatment.

## 2024-10-17 ENCOUNTER — Ambulatory Visit (HOSPITAL_COMMUNITY): Payer: Self-pay

## 2024-10-17 LAB — CERVICOVAGINAL ANCILLARY ONLY
Bacterial Vaginitis (gardnerella): POSITIVE — AB
Candida Glabrata: NEGATIVE
Candida Vaginitis: NEGATIVE
Chlamydia: NEGATIVE
Comment: NEGATIVE
Comment: NEGATIVE
Comment: NEGATIVE
Comment: NEGATIVE
Comment: NEGATIVE
Comment: NORMAL
Neisseria Gonorrhea: NEGATIVE
Trichomonas: NEGATIVE

## 2024-10-17 MED ORDER — METRONIDAZOLE 500 MG PO TABS: 500.0000 mg | ORAL_TABLET | Freq: Two times a day (BID) | ORAL | 0 refills | Status: AC

## 2024-10-22 ENCOUNTER — Ambulatory Visit

## 2024-12-02 ENCOUNTER — Other Ambulatory Visit: Payer: Self-pay

## 2024-12-02 ENCOUNTER — Encounter (HOSPITAL_COMMUNITY): Payer: Self-pay

## 2024-12-02 ENCOUNTER — Ambulatory Visit (HOSPITAL_COMMUNITY)
Admission: RE | Admit: 2024-12-02 | Discharge: 2024-12-02 | Disposition: A | Discharge status: Home / Self Care | Source: Ambulatory Visit

## 2024-12-02 VITALS — BP 116/74 | HR 68 | Temp 97.4°F | Resp 16

## 2024-12-02 DIAGNOSIS — N760 Acute vaginitis: Secondary | ICD-10-CM

## 2024-12-02 DIAGNOSIS — Z113 Encounter for screening for infections with a predominantly sexual mode of transmission: Secondary | ICD-10-CM | POA: Diagnosis not present

## 2024-12-02 LAB — POCT URINE DIPSTICK
Bilirubin, UA: NEGATIVE
Blood, UA: NEGATIVE
Glucose, UA: NEGATIVE mg/dL
Ketones, POC UA: NEGATIVE mg/dL
Nitrite, UA: NEGATIVE
POC PROTEIN,UA: NEGATIVE
Spec Grav, UA: 1.02
Urobilinogen, UA: 0.2 U/dL
pH, UA: 7

## 2024-12-02 LAB — POCT URINE PREGNANCY: Preg Test, Ur: NEGATIVE

## 2024-12-02 MED ORDER — METRONIDAZOLE 500 MG PO TABS: 500.0000 mg | ORAL_TABLET | Freq: Two times a day (BID) | ORAL | 0 refills | Status: AC

## 2024-12-02 NOTE — ED Triage Notes (Signed)
 C/O abd cramping and malodorous vaginal discharge over past week. Also c/o polyuria and foul smelling urine. Denies fevers. Denies n/v/d.

## 2024-12-03 LAB — CERVICOVAGINAL ANCILLARY ONLY
Bacterial Vaginitis (gardnerella): NEGATIVE
Candida Glabrata: NEGATIVE
Candida Vaginitis: NEGATIVE
Chlamydia: NEGATIVE
Comment: NEGATIVE
Comment: NEGATIVE
Comment: NEGATIVE
Comment: NEGATIVE
Comment: NEGATIVE
Comment: NORMAL
Neisseria Gonorrhea: NEGATIVE
Trichomonas: NEGATIVE

## 2024-12-05 ENCOUNTER — Ambulatory Visit (HOSPITAL_COMMUNITY): Payer: Self-pay

## 2024-12-05 LAB — URINE CULTURE: Culture: >=100000 — AB

## 2024-12-05 MED ORDER — CIPROFLOXACIN HCL 250 MG PO TABS: 250.0000 mg | ORAL_TABLET | Freq: Two times a day (BID) | ORAL | 0 refills | Status: AC

## 2024-12-05 NOTE — Telephone Encounter (Signed)
 Prescription for ciprofloxacin  2 and 50 mg twice daily x 7 days sent to [facility] on [address].

## 2024-12-06 LAB — MISC LABCORP TEST (SEND OUT): Labcorp test code: 180076
